# Patient Record
Sex: Female | Born: 1975 | Race: Black or African American | Hispanic: No | Marital: Single | Smoking: Former smoker
Health system: Southern US, Community
[De-identification: ages and names within clinical notes are randomized; demographics above are authoritative.]

## PROBLEM LIST (undated history)

## (undated) DIAGNOSIS — M797 Fibromyalgia: Secondary | ICD-10-CM

## (undated) DIAGNOSIS — G4700 Insomnia, unspecified: Secondary | ICD-10-CM

## (undated) DIAGNOSIS — I208 Other forms of angina pectoris: Secondary | ICD-10-CM

## (undated) DIAGNOSIS — I2089 Other forms of angina pectoris: Secondary | ICD-10-CM

## (undated) DIAGNOSIS — F4310 Post-traumatic stress disorder, unspecified: Secondary | ICD-10-CM

## (undated) DIAGNOSIS — M1990 Unspecified osteoarthritis, unspecified site: Secondary | ICD-10-CM

## (undated) DIAGNOSIS — G629 Polyneuropathy, unspecified: Secondary | ICD-10-CM

## (undated) DIAGNOSIS — F419 Anxiety disorder, unspecified: Secondary | ICD-10-CM

## (undated) HISTORY — DX: Polyneuropathy, unspecified: G62.9

## (undated) HISTORY — DX: Fibromyalgia: M79.7

## (undated) HISTORY — DX: Other forms of angina pectoris: I20.89

## (undated) HISTORY — DX: Other forms of angina pectoris: I20.8

## (undated) HISTORY — DX: Unspecified osteoarthritis, unspecified site: M19.90

## (undated) HISTORY — DX: Insomnia, unspecified: G47.00

## (undated) HISTORY — PX: TUBAL LIGATION: SHX77

## (undated) HISTORY — DX: Anxiety disorder, unspecified: F41.9

## (undated) HISTORY — PX: OTHER SURGICAL HISTORY: SHX169

## (undated) HISTORY — DX: Post-traumatic stress disorder, unspecified: F43.10

---

## 1997-12-01 ENCOUNTER — Inpatient Hospital Stay (HOSPITAL_COMMUNITY): Admission: AD | Admit: 1997-12-01 | Discharge: 1997-12-01 | Payer: Self-pay | Admitting: *Deleted

## 1997-12-04 ENCOUNTER — Inpatient Hospital Stay (HOSPITAL_COMMUNITY): Admission: AD | Admit: 1997-12-04 | Discharge: 1997-12-04 | Payer: Self-pay | Admitting: Obstetrics

## 1998-07-25 ENCOUNTER — Inpatient Hospital Stay (HOSPITAL_COMMUNITY): Admission: AD | Admit: 1998-07-25 | Discharge: 1998-07-25 | Payer: Self-pay | Admitting: Obstetrics

## 1998-09-04 HISTORY — PX: CHOLECYSTECTOMY: SHX55

## 1999-02-27 ENCOUNTER — Inpatient Hospital Stay (HOSPITAL_COMMUNITY): Admission: AD | Admit: 1999-02-27 | Discharge: 1999-02-27 | Payer: Self-pay | Admitting: Obstetrics & Gynecology

## 1999-10-26 ENCOUNTER — Inpatient Hospital Stay (HOSPITAL_COMMUNITY): Admission: AD | Admit: 1999-10-26 | Discharge: 1999-10-26 | Payer: Self-pay | Admitting: *Deleted

## 1999-11-03 ENCOUNTER — Inpatient Hospital Stay (HOSPITAL_COMMUNITY): Admission: AD | Admit: 1999-11-03 | Discharge: 1999-11-03 | Payer: Self-pay | Admitting: Obstetrics & Gynecology

## 2000-04-27 ENCOUNTER — Inpatient Hospital Stay (HOSPITAL_COMMUNITY): Admission: AD | Admit: 2000-04-27 | Discharge: 2000-04-27 | Payer: Self-pay | Admitting: Obstetrics & Gynecology

## 2000-05-02 ENCOUNTER — Inpatient Hospital Stay (HOSPITAL_COMMUNITY): Admission: AD | Admit: 2000-05-02 | Discharge: 2000-05-02 | Payer: Self-pay | Admitting: Obstetrics

## 2000-08-12 ENCOUNTER — Inpatient Hospital Stay (HOSPITAL_COMMUNITY): Admission: AD | Admit: 2000-08-12 | Discharge: 2000-08-12 | Payer: Self-pay | Admitting: Obstetrics & Gynecology

## 2000-08-14 ENCOUNTER — Inpatient Hospital Stay (HOSPITAL_COMMUNITY): Admission: AD | Admit: 2000-08-14 | Discharge: 2000-08-14 | Payer: Self-pay | Admitting: *Deleted

## 2000-09-29 ENCOUNTER — Inpatient Hospital Stay (HOSPITAL_COMMUNITY): Admission: AD | Admit: 2000-09-29 | Discharge: 2000-09-29 | Payer: Self-pay | Admitting: *Deleted

## 2001-02-17 ENCOUNTER — Inpatient Hospital Stay (HOSPITAL_COMMUNITY): Admission: AD | Admit: 2001-02-17 | Discharge: 2001-02-17 | Payer: Self-pay | Admitting: Obstetrics

## 2001-03-09 ENCOUNTER — Encounter: Payer: Self-pay | Admitting: *Deleted

## 2001-03-09 ENCOUNTER — Inpatient Hospital Stay (HOSPITAL_COMMUNITY): Admission: AD | Admit: 2001-03-09 | Discharge: 2001-03-09 | Payer: Self-pay | Admitting: *Deleted

## 2001-04-13 ENCOUNTER — Inpatient Hospital Stay (HOSPITAL_COMMUNITY): Admission: AD | Admit: 2001-04-13 | Discharge: 2001-04-13 | Payer: Self-pay | Admitting: *Deleted

## 2001-05-07 ENCOUNTER — Other Ambulatory Visit: Admission: RE | Admit: 2001-05-07 | Discharge: 2001-05-07 | Payer: Self-pay | Admitting: Obstetrics and Gynecology

## 2001-06-11 ENCOUNTER — Inpatient Hospital Stay (HOSPITAL_COMMUNITY): Admission: AD | Admit: 2001-06-11 | Discharge: 2001-06-11 | Payer: Self-pay | Admitting: *Deleted

## 2001-08-19 ENCOUNTER — Inpatient Hospital Stay (HOSPITAL_COMMUNITY): Admission: AD | Admit: 2001-08-19 | Discharge: 2001-08-19 | Payer: Self-pay | Admitting: Obstetrics and Gynecology

## 2001-09-02 ENCOUNTER — Inpatient Hospital Stay (HOSPITAL_COMMUNITY): Admission: AD | Admit: 2001-09-02 | Discharge: 2001-09-02 | Payer: Self-pay | Admitting: Obstetrics and Gynecology

## 2001-09-19 ENCOUNTER — Inpatient Hospital Stay (HOSPITAL_COMMUNITY): Admission: AD | Admit: 2001-09-19 | Discharge: 2001-09-19 | Payer: Self-pay | Admitting: Obstetrics and Gynecology

## 2001-09-21 ENCOUNTER — Observation Stay (HOSPITAL_COMMUNITY): Admission: AD | Admit: 2001-09-21 | Discharge: 2001-09-22 | Payer: Self-pay

## 2001-09-21 ENCOUNTER — Encounter: Payer: Self-pay | Admitting: Obstetrics and Gynecology

## 2001-10-12 ENCOUNTER — Inpatient Hospital Stay (HOSPITAL_COMMUNITY): Admission: AD | Admit: 2001-10-12 | Discharge: 2001-10-12 | Payer: Self-pay | Admitting: Obstetrics and Gynecology

## 2001-10-17 ENCOUNTER — Inpatient Hospital Stay (HOSPITAL_COMMUNITY): Admission: AD | Admit: 2001-10-17 | Discharge: 2001-10-18 | Payer: Self-pay | Admitting: Otolaryngology

## 2001-11-19 ENCOUNTER — Other Ambulatory Visit: Admission: RE | Admit: 2001-11-19 | Discharge: 2001-11-19 | Payer: Self-pay | Admitting: Obstetrics and Gynecology

## 2002-02-02 ENCOUNTER — Emergency Department (HOSPITAL_COMMUNITY): Admission: EM | Admit: 2002-02-02 | Discharge: 2002-02-03 | Payer: Self-pay | Admitting: *Deleted

## 2002-02-03 ENCOUNTER — Encounter: Payer: Self-pay | Admitting: *Deleted

## 2002-03-21 ENCOUNTER — Encounter: Payer: Self-pay | Admitting: Obstetrics

## 2002-03-21 ENCOUNTER — Inpatient Hospital Stay (HOSPITAL_COMMUNITY): Admission: AD | Admit: 2002-03-21 | Discharge: 2002-03-21 | Payer: Self-pay | Admitting: Obstetrics

## 2002-03-30 ENCOUNTER — Inpatient Hospital Stay (HOSPITAL_COMMUNITY): Admission: AD | Admit: 2002-03-30 | Discharge: 2002-03-30 | Payer: Self-pay | Admitting: Obstetrics

## 2002-03-30 ENCOUNTER — Encounter: Payer: Self-pay | Admitting: Obstetrics

## 2002-04-11 ENCOUNTER — Ambulatory Visit (HOSPITAL_COMMUNITY): Admission: RE | Admit: 2002-04-11 | Discharge: 2002-04-11 | Payer: Self-pay | Admitting: Obstetrics

## 2002-04-11 ENCOUNTER — Encounter: Payer: Self-pay | Admitting: Obstetrics

## 2002-04-29 ENCOUNTER — Ambulatory Visit (HOSPITAL_COMMUNITY): Admission: AD | Admit: 2002-04-29 | Discharge: 2002-04-29 | Payer: Self-pay | Admitting: Obstetrics

## 2002-04-29 ENCOUNTER — Encounter (INDEPENDENT_AMBULATORY_CARE_PROVIDER_SITE_OTHER): Payer: Self-pay | Admitting: Specialist

## 2002-07-11 ENCOUNTER — Inpatient Hospital Stay (HOSPITAL_COMMUNITY): Admission: AD | Admit: 2002-07-11 | Discharge: 2002-07-11 | Payer: Self-pay | Admitting: Obstetrics

## 2002-07-21 ENCOUNTER — Inpatient Hospital Stay (HOSPITAL_COMMUNITY): Admission: AD | Admit: 2002-07-21 | Discharge: 2002-07-21 | Payer: Self-pay | Admitting: Obstetrics

## 2002-09-20 ENCOUNTER — Inpatient Hospital Stay (HOSPITAL_COMMUNITY): Admission: AD | Admit: 2002-09-20 | Discharge: 2002-09-22 | Payer: Self-pay | Admitting: *Deleted

## 2002-09-20 ENCOUNTER — Encounter: Payer: Self-pay | Admitting: Obstetrics

## 2002-12-07 ENCOUNTER — Inpatient Hospital Stay (HOSPITAL_COMMUNITY): Admission: AD | Admit: 2002-12-07 | Discharge: 2002-12-07 | Payer: Self-pay | Admitting: Obstetrics

## 2002-12-19 ENCOUNTER — Inpatient Hospital Stay (HOSPITAL_COMMUNITY): Admission: AD | Admit: 2002-12-19 | Discharge: 2002-12-19 | Payer: Self-pay | Admitting: Obstetrics

## 2003-01-29 ENCOUNTER — Inpatient Hospital Stay (HOSPITAL_COMMUNITY): Admission: AD | Admit: 2003-01-29 | Discharge: 2003-02-01 | Payer: Self-pay | Admitting: Obstetrics

## 2003-02-23 ENCOUNTER — Inpatient Hospital Stay (HOSPITAL_COMMUNITY): Admission: AD | Admit: 2003-02-23 | Discharge: 2003-02-23 | Payer: Self-pay | Admitting: Obstetrics

## 2003-03-07 ENCOUNTER — Inpatient Hospital Stay (HOSPITAL_COMMUNITY): Admission: AD | Admit: 2003-03-07 | Discharge: 2003-03-07 | Payer: Self-pay | Admitting: Obstetrics

## 2003-03-17 ENCOUNTER — Inpatient Hospital Stay (HOSPITAL_COMMUNITY): Admission: AD | Admit: 2003-03-17 | Discharge: 2003-03-19 | Payer: Self-pay | Admitting: Obstetrics

## 2003-03-23 ENCOUNTER — Inpatient Hospital Stay (HOSPITAL_COMMUNITY): Admission: AD | Admit: 2003-03-23 | Discharge: 2003-03-23 | Payer: Self-pay | Admitting: Obstetrics

## 2003-05-21 ENCOUNTER — Encounter (HOSPITAL_BASED_OUTPATIENT_CLINIC_OR_DEPARTMENT_OTHER): Payer: Self-pay | Admitting: General Surgery

## 2003-05-22 ENCOUNTER — Encounter (HOSPITAL_BASED_OUTPATIENT_CLINIC_OR_DEPARTMENT_OTHER): Payer: Self-pay | Admitting: General Surgery

## 2003-05-22 ENCOUNTER — Ambulatory Visit (HOSPITAL_COMMUNITY): Admission: RE | Admit: 2003-05-22 | Discharge: 2003-05-23 | Payer: Self-pay | Admitting: General Surgery

## 2003-05-22 ENCOUNTER — Encounter (INDEPENDENT_AMBULATORY_CARE_PROVIDER_SITE_OTHER): Payer: Self-pay | Admitting: Specialist

## 2004-01-04 ENCOUNTER — Emergency Department (HOSPITAL_COMMUNITY): Admission: EM | Admit: 2004-01-04 | Discharge: 2004-01-04 | Payer: Self-pay | Admitting: Emergency Medicine

## 2004-03-22 ENCOUNTER — Ambulatory Visit (HOSPITAL_COMMUNITY): Admission: AD | Admit: 2004-03-22 | Discharge: 2004-03-22 | Payer: Self-pay | Admitting: *Deleted

## 2004-03-22 ENCOUNTER — Encounter (INDEPENDENT_AMBULATORY_CARE_PROVIDER_SITE_OTHER): Payer: Self-pay | Admitting: Specialist

## 2004-05-02 ENCOUNTER — Emergency Department (HOSPITAL_COMMUNITY): Admission: EM | Admit: 2004-05-02 | Discharge: 2004-05-03 | Payer: Self-pay | Admitting: Emergency Medicine

## 2004-12-06 ENCOUNTER — Inpatient Hospital Stay (HOSPITAL_COMMUNITY): Admission: AD | Admit: 2004-12-06 | Discharge: 2004-12-06 | Payer: Self-pay | Admitting: Obstetrics

## 2004-12-31 ENCOUNTER — Inpatient Hospital Stay (HOSPITAL_COMMUNITY): Admission: AD | Admit: 2004-12-31 | Discharge: 2004-12-31 | Payer: Self-pay | Admitting: Obstetrics

## 2005-02-17 ENCOUNTER — Inpatient Hospital Stay (HOSPITAL_COMMUNITY): Admission: AD | Admit: 2005-02-17 | Discharge: 2005-02-17 | Payer: Self-pay | Admitting: Obstetrics

## 2005-03-19 ENCOUNTER — Inpatient Hospital Stay (HOSPITAL_COMMUNITY): Admission: AD | Admit: 2005-03-19 | Discharge: 2005-03-19 | Payer: Self-pay | Admitting: Obstetrics

## 2005-05-08 ENCOUNTER — Inpatient Hospital Stay (HOSPITAL_COMMUNITY): Admission: AD | Admit: 2005-05-08 | Discharge: 2005-05-08 | Payer: Self-pay | Admitting: Obstetrics

## 2005-06-03 ENCOUNTER — Inpatient Hospital Stay (HOSPITAL_COMMUNITY): Admission: AD | Admit: 2005-06-03 | Discharge: 2005-06-03 | Payer: Self-pay | Admitting: Obstetrics

## 2005-06-17 ENCOUNTER — Inpatient Hospital Stay (HOSPITAL_COMMUNITY): Admission: AD | Admit: 2005-06-17 | Discharge: 2005-06-17 | Payer: Self-pay | Admitting: Obstetrics

## 2005-07-07 ENCOUNTER — Encounter (INDEPENDENT_AMBULATORY_CARE_PROVIDER_SITE_OTHER): Payer: Self-pay | Admitting: Specialist

## 2005-07-07 ENCOUNTER — Inpatient Hospital Stay (HOSPITAL_COMMUNITY): Admission: AD | Admit: 2005-07-07 | Discharge: 2005-07-10 | Payer: Self-pay | Admitting: Obstetrics

## 2006-07-17 ENCOUNTER — Inpatient Hospital Stay (HOSPITAL_COMMUNITY): Admission: AD | Admit: 2006-07-17 | Discharge: 2006-07-18 | Payer: Self-pay | Admitting: Obstetrics

## 2007-01-29 ENCOUNTER — Ambulatory Visit: Payer: Self-pay | Admitting: Gastroenterology

## 2007-01-29 LAB — CONVERTED CEMR LAB
AST: 17 units/L (ref 0–37)
Albumin: 3.9 g/dL (ref 3.5–5.2)
BUN: 13 mg/dL (ref 6–23)
Basophils Relative: 0.7 % (ref 0.0–1.0)
Bilirubin, Direct: 0.1 mg/dL (ref 0.0–0.3)
MCHC: 35.2 g/dL (ref 30.0–36.0)
MCV: 88.1 fL (ref 78.0–100.0)
Neutro Abs: 3.8 10*3/uL (ref 1.4–7.7)
Neutrophils Relative %: 55 % (ref 43.0–77.0)
Platelets: 290 10*3/uL (ref 150–400)
RBC: 4.36 M/uL (ref 3.87–5.11)
RDW: 11.8 % (ref 11.5–14.6)
Sodium: 140 meq/L (ref 135–145)
Total Bilirubin: 0.6 mg/dL (ref 0.3–1.2)
WBC: 6.9 10*3/uL (ref 4.5–10.5)
hCG, Beta Chain, Quant, S: <0.5 milliintl units/mL

## 2007-01-31 ENCOUNTER — Encounter: Payer: Self-pay | Admitting: Gastroenterology

## 2007-11-30 ENCOUNTER — Inpatient Hospital Stay (HOSPITAL_COMMUNITY): Admission: AD | Admit: 2007-11-30 | Discharge: 2007-11-30 | Payer: Self-pay | Admitting: Obstetrics

## 2008-01-02 ENCOUNTER — Inpatient Hospital Stay (HOSPITAL_COMMUNITY): Admission: AD | Admit: 2008-01-02 | Discharge: 2008-01-02 | Payer: Self-pay | Admitting: Obstetrics

## 2008-03-02 ENCOUNTER — Emergency Department (HOSPITAL_COMMUNITY): Admission: EM | Admit: 2008-03-02 | Discharge: 2008-03-02 | Payer: Self-pay | Admitting: Emergency Medicine

## 2008-11-22 ENCOUNTER — Emergency Department (HOSPITAL_COMMUNITY): Admission: EM | Admit: 2008-11-22 | Discharge: 2008-11-22 | Payer: Self-pay | Admitting: Emergency Medicine

## 2009-02-14 ENCOUNTER — Emergency Department (HOSPITAL_COMMUNITY): Admission: EM | Admit: 2009-02-14 | Discharge: 2009-02-14 | Payer: Self-pay | Admitting: Emergency Medicine

## 2009-04-21 ENCOUNTER — Emergency Department (HOSPITAL_COMMUNITY): Admission: EM | Admit: 2009-04-21 | Discharge: 2009-04-21 | Payer: Self-pay | Admitting: Emergency Medicine

## 2010-06-03 ENCOUNTER — Ambulatory Visit: Payer: Self-pay | Admitting: Nurse Practitioner

## 2010-06-03 ENCOUNTER — Inpatient Hospital Stay (HOSPITAL_COMMUNITY): Admission: AD | Admit: 2010-06-03 | Discharge: 2010-06-04 | Payer: Self-pay | Admitting: Obstetrics and Gynecology

## 2010-11-16 LAB — GC/CHLAMYDIA PROBE AMP, GENITAL: Chlamydia, DNA Probe: NEGATIVE

## 2010-11-16 LAB — URINALYSIS, ROUTINE W REFLEX MICROSCOPIC
Ketones, ur: NEGATIVE mg/dL
Specific Gravity, Urine: 1.03 (ref 1.005–1.030)
Urobilinogen, UA: 0.2 mg/dL (ref 0.0–1.0)
pH: 6 (ref 5.0–8.0)

## 2010-11-16 LAB — WET PREP, GENITAL

## 2010-12-12 LAB — RAPID STREP SCREEN (MED CTR MEBANE ONLY): Streptococcus, Group A Screen (Direct): NEGATIVE

## 2011-01-02 ENCOUNTER — Emergency Department (HOSPITAL_COMMUNITY)
Admission: EM | Admit: 2011-01-02 | Discharge: 2011-01-02 | Disposition: A | Discharge status: Home / Self Care | Payer: Medicaid Other | Attending: Emergency Medicine | Admitting: Emergency Medicine

## 2011-01-02 DIAGNOSIS — R109 Unspecified abdominal pain: Secondary | ICD-10-CM | POA: Insufficient documentation

## 2011-01-02 DIAGNOSIS — M549 Dorsalgia, unspecified: Secondary | ICD-10-CM | POA: Insufficient documentation

## 2011-01-02 LAB — URINALYSIS, ROUTINE W REFLEX MICROSCOPIC
Bilirubin Urine: NEGATIVE
Glucose, UA: NEGATIVE mg/dL
Hgb urine dipstick: NEGATIVE
Ketones, ur: NEGATIVE mg/dL
Specific Gravity, Urine: 1.02 (ref 1.005–1.030)
Urobilinogen, UA: 1 mg/dL (ref 0.0–1.0)
pH: 7.5 (ref 5.0–8.0)

## 2011-01-17 NOTE — Assessment & Plan Note (Signed)
[facility]                         GASTROENTEROLOGY OFFICE NOTE   NYLANI, MICHETTI                        MRN:          [id number]  DATE:01/29/2007                            DOB:          [birth year]    REFERRING PHYSICIAN:  Kathreen Cosier, M.D.   NEW GASTROINTESTINAL CONSULTATION:   REASON FOR REFERRAL:  Dr. Gaynell Face asked me to evaluate Haley Reyes in  consultation regarding upper and lower GI symptoms.   HISTORY OF PRESENT ILLNESS:  Haley Reyes is a very pleasant 35 year old  woman who has a constellation of upper and lower GI symptoms.  First,  she has had loose stools up to 2-3 times a day, nonbloody, ever since  her gallbladder was removed in approximately 2003.  She does not  generally have nocturnal symptoms.  Second, she has mild vomiting which  seems to be worse in the past 5-6 months; she says she will vomit once  to twice a day.  She has mild nausea recently.  She has fairly constant  gas and flatulence.  She says that she has definitely noticed that milk  tends to make matters worse, but she continues to eat other diary  products such as cheese and ice cream.  She has very are pyrosis.   REVIEW OF SYSTEMS:  Notable for a 5- to 10-pound weight gain in the past  year.  The rest of her review of systems is essentially normal and is  available on our nursing intake sheet.   PAST MEDICAL HISTORY:  1. Tubal ligation in 2006.  2. Gallbladder surgery, 2003.  3. Chronic headaches.  4. Depression.  5. Panic disorder.  6. Cervical surgery (LEEP?) in 2003 for moderate dysplasia.   CURRENT MEDICINES:  Claritin.   ALLERGIES:  No known drug allergies.   SOCIAL HISTORY:  Single with 4 children.  Lives by herself.  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  Smokes a  pack of cigarettes per day.  Drinks 2-3 caffeinated beverages a day,  approximately 1 beer a night.   FAMILY HISTORY:  Diabetes.  No colon  cancer or colon polyps in family.   PHYSICAL EXAMINATION:  Five feet 2 inches, 132 pounds.  Blood pressure  102/64, pulse 84.  CONSTITUTIONAL:  Generally well-appearing.  NEUROLOGIC:  Alert and oriented x3.  EYES:  Extraocular movements intact.  MOUTH:  Oropharynx moist, no lesions.  NECK:  Supple with no lymphadenopathy.  CARDIOVASCULAR:  Regular rate and rhythm.  ABDOMEN:  Soft, nontender and non-distended.  Normal bowel sounds.  EXTREMITIES:  No lower extremity edema.  SKIN:  No rash or lesions on visible extremities.   ASSESSMENT AND PLAN:  [age]-year-old woman with upper and lower  gastrointestinal symptoms.   First, it is not uncommon for people to have loose stools following  cholecystectomy; this is often bile-salt-related.  She has definitely  noticed that she is intolerant to milk, but continues to eat other dairy  products and so I am suspicious she may have a touch of lactose  intolerance.  She drinks at least 2-3 caffeinated  beverages a day as  well as beer a day and possibly these are causing some of her lower  gastrointestinal symptoms.  I recommended that she first stop dairy  completely for 1 week and if she is not better, then I recommended she  stop caffeinated beverages completely for 1 week.  If she notices no  difference with her symptoms at that point, she will begin taking  cholestyramine once to twice a day to see if we can regulate her bowels  that way.  She will get a set of laboratory tests today including a  complete metabolic panel, thyroid testing, a CBC and stool studies.  I  am also going to get a pregnancy test on her.  She did have her tubes  tied, but she says her lower abdomen is increasing in size and with  these symptoms, it makes one wonder.  She will return to see me in 4  weeks and sooner if needed.     Haley Fee, MD  Electronically Signed    DPJ/MedQ  DD: 01/29/2007  DT: 01/29/2007  Job #: [id number]   cc:   Kathreen Cosier, M.D.

## 2011-01-20 NOTE — Discharge Summary (Signed)
   NAME:  Haley Reyes, Haley Reyes                           ACCOUNT NO.:  [id number]   MEDICAL RECORD NO.:  [id number]                   PATIENT TYPE:  INP   LOCATION:  9303                                 FACILITY:  [facility]   PHYSICIAN:  Kathreen Cosier, M.D.           DATE OF BIRTH:  [birth year]   DATE OF ADMISSION:  09/20/2002  DATE OF DISCHARGE:  09/22/2002                                 DISCHARGE SUMMARY   HISTORY OF PRESENT ILLNESS:  The patient is a 35 year old gravida 4, para 2-  0-1-2 with an EDC of 03/08/2003 by ultrasound who is 13 weeks and 4 days  pregnancy.  She was admitted because of hyperemesis.   LABORATORY DATA:  On admission, white count 12.7, hemoglobin 11.5, platelets  343.  Sodium 136, potassium 3.5, chloride 101, CO2 23, glucose 83.  Urine  was negative for leukocyte esterase and nitrites.  Ketones were greater than  80.   HOSPITAL COURSE:  The patient was admitted for IV hydration.  She was  started on Reglan 10 mg p.o. t.i.d.  From the time of admission, the patient  had one episode of emesis on 09/21/2002 and none on 09/22/2002.  She was tried  on a regular diet on 09/22/2002 and did well.  The patient desired to go home  on 09/23/2002.   DISCHARGE MEDICATIONS:  Reglan 10 mg p.o. t.i.d.   DISCHARGE DIAGNOSIS:  Status post hyperemesis at 13+ weeks.                                                Kathreen Cosier, M.D.    BAM/MEDQ  D:  10/27/2002  T:  10/27/2002  Job:  [id number]

## 2011-01-20 NOTE — Discharge Summary (Signed)
   NAME:  Haley Reyes, Haley Reyes                           ACCOUNT NO.:  [id number]   MEDICAL RECORD NO.:  [id number]                   PATIENT TYPE:  INP   LOCATION:  9153                                 [facility]ACILITY[facility]   PHYSICIAN:  Kathreen Cosier, M.D.           DATE OF BIRTH:  1975/11/26   DATE OF ADMISSION:  01/29/2003  DATE OF DISCHARGE:  02/01/2003                                 DISCHARGE SUMMARY   DISCHARGE SUMMARY:  The patient is a 35 year old gravida 5, para 2-0-2-2,  [facility] March 22, 2003.  Her pregnancy was complicated by premature contractions  and she had been on terbutaline at home and she refused to take that because  she states, it did not work.  So, she was brought in and started on mag  sulfate.  By Jan 30, 2003 she was having less than four contractions per  hour on 2 grams of mag.  She was started on Motrin 800 every eight hours.  Her cervix was closed, vertex, -3.  She started to feel better and on May 29  the mag sulfate was discontinued.  She was started on Procardia 10 mg p.o.  every six hours.  The patient did well and was discharged home on Feb 01, 2003 on bedrest and on Procardia 10 mg p.o. every six hours.  Instructions  to keep herself well hydrated and on bedrest as much as possible.  To see me  in two weeks.   DISCHARGE DIAGNOSIS:  1. Premature contractions.  2. Intrauterine pregnancy.                                               Kathreen Cosier, M.D.    BAM/MEDQ  D:  02/26/2003  T:  02/28/2003  Job:  [id number]

## 2011-01-20 NOTE — Op Note (Signed)
NAME:  Haley Reyes, Haley Reyes                           ACCOUNT NO.:  [id number]   MEDICAL RECORD NO.:  [id number]                   PATIENT TYPE:  OIB   LOCATION:  5710                                 FACILITY:  [facility]   PHYSICIAN:  Leonie Man, M.D.                DATE OF BIRTH:  01-Sep-1976   DATE OF PROCEDURE:  05/22/2003  DATE OF DISCHARGE:                                 OPERATIVE REPORT   PREOPERATIVE DIAGNOSES:  1. Chronic calculous cholecystitis.  2. Probable choledocholithiasis.   POSTOPERATIVE DIAGNOSIS:  Chronic calculous cholecystitis.   PROCEDURE:  Laparoscopic cholecystectomy with intraoperative cholangiogram.   SURGEON:  Leonie Man, M.D.   ASSISTANT:  Joanne Gavel, M.D.   ANESTHESIA:  General.   NOTE:  This patient is a 35 year old recently postpartum female presenting  with epigastric and right-sided abdominal pain associated with nausea and  vomiting.  She has had a transient episode of hyperamylasemia with elevation  of her liver function studies, which have promptly returned to normal.  She  comes to the operating room now after the risks and potential benefits have  been fully discussed, all questions answered, and consent obtained.   DESCRIPTION OF PROCEDURE:  Following the induction of satisfactory general  anesthesia with the patient positioned supinely, the abdomen was routinely  prepped and draped to be included in the sterile operative field.  Open  laparoscopy is created through a supraumbilical incision with the insertion  of a Hasson cannula and insufflation of the peritoneal cavity to 14 mmHg  pressure using carbon dioxide.  The camera is inserted and a visual  exploration of the abdomen carried out.  The liver edge is sharp, the liver  surface is smooth.  The gallbladder did not appear to be chronically scarred  but did have some scarring down around the area of the ampulla.  The  anterior gastric wall and duodenal sweep appeared to be normal, and  none of  the large or small intestine viewed appeared to be abnormal.  Pelvic organs  were not visualized.   Under direct vision epigastric and lateral ports were placed, the  gallbladder is grasped and retracted cephalad, and dissection carried down  to the region of the ampulla with isolation of the cystic artery and cystic  duct.  The cystic artery was doubly clipped and transected.  The cystic duct  was clipped proximally and opened.  A cholangiocatheter was then placed  inside the cystic duct and one-half strength Hypaque was injected into the  extrahepatic biliary system with the resulting cholangiogram showing free  flow of contrast into the duodenum with no filling defects, normal-caliber  biliary ducts.  The cholangiocatheter was then removed and the cystic duct  doubly clipped and transected.  The gallbladder was dissected free from the  liver bed using electrocautery.  At the end of the dissection all areas of  dissection were checked for hemostasis  and additional bleeding points  treated with electrocautery.  The right upper quadrant was thoroughly  irrigated with normal saline and sucked dry.  The gallbladder was retrieved  through the umbilical port without difficulty.  The sponge, instrument, and  sharp counts were verified.  The pneumoperitoneum was allowed to deflate and  the wounds closed in layers as follows:  The umbilical wound in two  layers with 0 Vicryl and 4-0 Monocryl, epigastric and lateral flank wounds  closed with 4-0 Monocryl sutures.  All wounds reinforced with Steri-Strips  and sterile dressings applied.  Anesthetic reversed.  The patient removed  from the operating room to the recovery room in stable condition.  She  tolerated the procedure well.                                                Leonie Man, M.D.    PB/MEDQ  D:  05/22/2003  T:  05/23/2003  Job:  [id number]

## 2011-01-20 NOTE — Op Note (Signed)
NAMEPIPPA, HANIF NO.:  [id number]   MEDICAL RECORD NO.:  [id number]          PATIENT TYPE:  INP   LOCATION:  9106                          FACILITY:  [facility]   PHYSICIAN:  Kathreen Cosier, M.D.DATE OF BIRTH:  1976-02-15   DATE OF PROCEDURE:  07/08/2005  DATE OF DISCHARGE:                                 OPERATIVE REPORT   PREOPERATIVE DIAGNOSES:  Multiparity.   PROCEDURE:  Postpartum tubal ligation.   DESCRIPTION OF PROCEDURE:  Under general anesthesia, the patient in the  supine position, abdomen prepped and draped, bladder emptied straight  catheter.  Midline subumbilical incision 1 inch long was made and carried  down to the fascia.  The fascia cleaned, grasped with 2 Kochers, and then  the fascia and the peritoneum opened with Mayo scissors.  Left tube grasped  in mid portion with a Babcock clamp, tube traced to the fimbria, 0 plain  suture placed in the mesosalpinx below the portion of tube within the clamp.  This was tied, and approximately 1 inch of tube was transected.  Hemostasis  was satisfactory.  Procedure done in a similar fashion on the other side.  Abdomen closed in layers, peritoneum and fascia closed with continuous  suture of 0 Dexon, the skin closed with subcutaneous stitch of 4-0 Monocryl.  Blood loss minimal.           ______________________________  Kathreen Cosier, M.D.     BAM/MEDQ  D:  07/08/2005  T:  07/08/2005  Job:  [id number]

## 2011-01-20 NOTE — Op Note (Signed)
NAME:  Haley Reyes, Haley Reyes                           ACCOUNT NO.:  [id number]   MEDICAL RECORD NO.:  [id number]                   PATIENT TYPE:  AMB   LOCATION:  [facility]                                 [facility]ACILITY:  [facility]   PHYSICIAN:  Sherry A. Rosalio Macadamia, M.D.           DATE OF BIRTH:  01-06-1976   DATE OF PROCEDURE:  04/29/2002  DATE OF DISCHARGE:                                 OPERATIVE REPORT   PREOPERATIVE DIAGNOSES:  1. Intrauterine pregnancy.  2. Incomplete abortion.   POSTOPERATIVE DIAGNOSES:  1. Intrauterine pregnancy.  2. Incomplete abortion.   PROCEDURE:  Dilatation and evacuation.   SURGEON:  Sherry A. Rosalio Macadamia, M.D.   ANESTHESIA:  MAC.   INDICATIONS:  This is a 34 year old G58, P1-1-1-2 female whose last menstrual  period was approximately February 03, 2002.  The patient was seen in the  emergency room on March 21, 2002, because of left sided abdominal pain.  Ultrasound was performed revealing a left ovarian cystic structure  consistent with corpus luteum cyst or ectopic pregnancy.  No intrauterine  pregnancy could be seen on ultrasound.  The patient was treated with  methotrexate, however, it is understood that she did not return for followup  quantitative hCG results.  Initial hCG was approximately 1200.  Followup  ultrasound on March 30, 2002, revealed an intrauterine sac at approximately 5-  1/7 weeks.  Repeat ultrasound on April 11, 2002, revealed an intrauterine  pregnancy at 7-2/7 weeks with cardiac activity present.  The sac, however,  was not felt to be normal.  The patient noted onset of bleeding and cramping  one day prior to admission with increased bleeding and cramping on the day  of admission.  Pelvic examination revealed the cervix to be approximately 1  cm dilated with tissue starting to protrude through the os.  Because of  this, the diagnosis of incomplete abortion was made.  The patient was  brought to the operating room for D&E.   FINDINGS:  Eight week size  anteflexed uterus.  No adnexal mass.  Amniotic  fluid ruptured just prior to D&E.  The cervix was dilated 1 cm with tissue  in os.   DESCRIPTION OF PROCEDURE:  The patient was brought into the operating room  and given adequate IV sedation.  She was placed in the dorsal lithotomy  position.  The perineum was washed with Hibiclens.  The patient was draped  in the sterile fashion.  Speculum was placed within the vagina.  The vagina  was washed with Hibiclens.  A paracervical block was administered with 1%  Nesacaine.  The anterior lip of the cervix was grasped with a single-tooth  tenaculum.  The cervix was not needed to be sounded or dilated.  An 8 mm  curved curet was introduced into the endometrial cavity.  Amniotic fluid had  already ruptured.  Suction was applied.  Tissue was obtained.  Uterine walls  were  easily palpated with the curet.  Suction was continued until no further  tissue was present and no significant bleeding was present.  The tenaculum  was removed from the cervix.  A small amount of bleeding was  present from the tenaculum site.  This was stopped with pressure.  All  instruments were removed from the vagina.  The patient was taken out of the  dorsal lithotomy position.  She was awakened.  She was removed from the  operating table to a stretcher in stable condition.  Complications were  none.  Estimated blood loss 10 cc.                                               Sherry A. Rosalio Macadamia, M.D.    SAD/MEDQ  D:  04/29/2002  T:  04/30/2002  Job:  [id number]

## 2011-01-20 NOTE — Op Note (Signed)
NAME:  CURTINA, GRILLS                           ACCOUNT NO.:  [id number]   MEDICAL RECORD NO.:  [id number]                   PATIENT TYPE:  AMB   LOCATION:  [facility]                                 [facility]ACILITY[facility]   PHYSICIAN:  Conni Elliot, M.D.             DATE OF BIRTH:  [birth year]   DATE OF PROCEDURE:  03/22/2004  DATE OF DISCHARGE:                                 OPERATIVE REPORT   PREOPERATIVE DIAGNOSES:  Retained products of conception following  therapeutic abortion.   POSTOPERATIVE DIAGNOSES:  Retained products of conception following  therapeutic abortion.   OPERATION:  Dilatation evacuation.   SURGEON:  Conni Elliot, M.D.   ANESTHESIA:  MAC.   FINDINGS:  The uterus is approximately 9-10 week size, mid plane to  anterior. The os was open and with minimal dilatation accepted up to a 31  dilator.   DESCRIPTION OF PROCEDURE:  After placing the patient in the dorsal lithotomy  position, the patient was placed under MAC anesthesia. The perineum and  vagina was prepped with __________ solution.  With a speculum placed in the  posterior vagina, the anterior cervix was grasped with a tenaculum. A  paracervical block was placed. The uterus was slowly dilated although it was  of minimal dilatation force and was taken up to a #31 dilator and then the  #10 suction curettage was utilized to evacuate the uterus and a moderate  amount of material was obtained.  __________ was identified by sharp  curettage. Estimated blood loss was less than 100 mL.                                               Conni Elliot, M.D.    ASG/MEDQ  D:  03/22/2004  T:  03/23/2004  Job:  [id number]

## 2011-05-29 LAB — HCG, SERUM, QUALITATIVE: Preg, Serum: NEGATIVE

## 2011-05-29 LAB — WET PREP, GENITAL
Clue Cells Wet Prep HPF POC: NONE SEEN
Yeast Wet Prep HPF POC: NONE SEEN

## 2011-05-29 LAB — URINALYSIS, ROUTINE W REFLEX MICROSCOPIC
Bilirubin Urine: NEGATIVE
Ketones, ur: NEGATIVE
Nitrite: NEGATIVE
Protein, ur: NEGATIVE
Urobilinogen, UA: 0.2

## 2011-05-29 LAB — CBC
HCT: 38
Hemoglobin: 13.1
RBC: 4.23
WBC: 10.8 — ABNORMAL HIGH

## 2011-05-29 LAB — DIFFERENTIAL
Basophils Absolute: 0.1
Basophils Relative: 1
Eosinophils Relative: 2
Monocytes Absolute: 0.8

## 2011-05-30 LAB — POCT PREGNANCY, URINE: Operator id: 251141

## 2011-05-30 LAB — URINALYSIS, ROUTINE W REFLEX MICROSCOPIC
Bilirubin Urine: NEGATIVE
Hgb urine dipstick: NEGATIVE
Ketones, ur: NEGATIVE
Protein, ur: NEGATIVE
Urobilinogen, UA: 0.2

## 2011-05-30 LAB — WET PREP, GENITAL
Clue Cells Wet Prep HPF POC: NONE SEEN
Trich, Wet Prep: NONE SEEN
Yeast Wet Prep HPF POC: NONE SEEN

## 2011-06-01 LAB — RAPID STREP SCREEN (MED CTR MEBANE ONLY): Streptococcus, Group A Screen (Direct): POSITIVE — AB

## 2012-02-23 ENCOUNTER — Encounter: Payer: Self-pay | Admitting: Pulmonary Disease

## 2012-02-26 ENCOUNTER — Institutional Professional Consult (permissible substitution): Payer: Medicaid Other | Admitting: Pulmonary Disease

## 2012-03-06 ENCOUNTER — Encounter: Payer: Self-pay | Admitting: Pulmonary Disease

## 2012-04-09 ENCOUNTER — Institutional Professional Consult (permissible substitution): Payer: Medicaid Other | Admitting: Pulmonary Disease

## 2012-05-08 ENCOUNTER — Institutional Professional Consult (permissible substitution): Payer: Medicaid Other | Admitting: Pulmonary Disease

## 2013-01-06 ENCOUNTER — Encounter: Payer: Self-pay | Admitting: Pulmonary Disease

## 2013-01-06 ENCOUNTER — Ambulatory Visit (INDEPENDENT_AMBULATORY_CARE_PROVIDER_SITE_OTHER): Payer: Medicaid Other | Admitting: Pulmonary Disease

## 2013-01-06 ENCOUNTER — Telehealth: Payer: Self-pay | Admitting: Pulmonary Disease

## 2013-01-06 VITALS — BP 110/70 | HR 77 | Temp 98.2°F | Ht 62.0 in | Wt 136.8 lb

## 2013-01-06 DIAGNOSIS — G4700 Insomnia, unspecified: Secondary | ICD-10-CM

## 2013-01-06 DIAGNOSIS — F518 Other sleep disorders not due to a substance or known physiological condition: Secondary | ICD-10-CM

## 2013-01-06 DIAGNOSIS — F5104 Psychophysiologic insomnia: Secondary | ICD-10-CM | POA: Insufficient documentation

## 2013-01-06 DIAGNOSIS — Z72821 Inadequate sleep hygiene: Secondary | ICD-10-CM | POA: Insufficient documentation

## 2013-01-06 NOTE — Telephone Encounter (Signed)
[id number] x1 

## 2013-01-06 NOTE — Assessment & Plan Note (Signed)
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  I have outlined stimulus control therapy for her, reviewed with her the bad habits regarding sleep, and also discussed how to reestablish her normal circadian rhythm.  This is going to be very difficult for her, especially since she has gotten her days and nights mixed up.  I have explained to her that sleeping medications are not the answer for this problem.  If she tries the above methods and continues to have problems, I would strongly suggest referral to behavioral medicine for cognitive behavioral therapy.

## 2013-01-06 NOTE — Telephone Encounter (Signed)
Spoke to pt. States that she has to stand to relieve the pain in her thighs. Her thighs "tingle" most of the day. She wanted KC to be aware of this information. I advised that I would forward this message to him.

## 2013-01-06 NOTE — Progress Notes (Signed)
Subjective:    Patient ID: Haley Reyes, female    DOB: 09/14/1975, 37 y.o.   MRN: [id number]  HPI The patient is a 37 year old female who I've been asked to see for insomnia.  The patient states that she has had sleeping issues for years and years, and this often results in her back going to bed until 2 to 3 AM in the morning.  She primarily has issues with both sleep onset and maintenance, which then results in significant daytime sleepiness.  She is has never had consistent snoring or an abnormal breathing pattern during sleep, but is unsure if she kicks during the night.  She denies any symptoms consistent with restless leg syndrome.  The patient walk and watch television in bed around 2 AM, and also reads in bed at times.  It should be noted that her children often sleep in the bed with her, or on the floor on a mat.  She feels that her mind racing is the issue with sleep onset, and she will typically stay in bed and toss and turn.  She also notes chronic pain from her fibromyalgia.  She often will get up during the night and eat and drink.  She drinks 2 cups of coffee during the am, but no caffeine thereafter.  She notes severe sleepiness during the day, and will nap quite a bit during the afternoon. Her epworth score today is 24.      Sleep Questionnaire What time do you typically go to bed?( Between what hours) 12a-2am 12a-2am at 1133 on 01/06/13 by Nita Sells, CMA How long does it take you to fall asleep? hours---unless pt takes medication to help sleep hours---unless pt takes medication to help sleep at 1133 on 01/06/13 by Nita Sells, CMA How many times during the night do you wake up? 3 3 at 1133 on 01/06/13 by Nita Sells, CMA What time do you get out of bed to start your day? [id number] 5am-6am at 1133 on 01/06/13 by Nita Sells, CMA Do you drive or operate heavy machinery in your occupation? No No at 1133 on 01/06/13 by Nita Sells, CMA How much has your weight changed  (up or down) over the past two years? (In pounds) 20 lb (9.072 kg)20 lb (9.072 kg) +/- pt ranges from 136lb-150lb at 1133 on 01/06/13 by Marjo Bicker Mabe, CMA Have you ever had a sleep study before? No No at 1133 on 01/06/13 by Marjo Bicker Mabe, CMA If yes, location of study? If yes, date of study? Do you currently use CPAP? No No at 1133 on 01/06/13 by Marjo Bicker Mabe, CMA Do you wear oxygen at any time? No No at 1133 on 01/06/13 by Marjo Bicker Mabe, CMA   Review of Systems  Constitutional: Positive for unexpected weight change. Negative for fever.  HENT: Positive for congestion, sneezing, dental problem and sinus pressure. Negative for ear pain, nosebleeds, sore throat, rhinorrhea, trouble swallowing and postnasal drip.   Eyes: Negative for redness and itching.  Respiratory: Positive for cough and shortness of breath. Negative for chest tightness and wheezing.   Cardiovascular: Positive for palpitations and leg swelling ( feet swelling).  Gastrointestinal: Positive for abdominal pain. Negative for nausea and vomiting.       Acid heartburn//indigestion  Genitourinary: Negative for dysuria.  Musculoskeletal: Positive for joint swelling and arthralgias.  Skin: Positive for rash ( itching).  Neurological: Positive for headaches.  Hematological: Does not bruise/bleed easily.  Psychiatric/Behavioral: Positive for  dysphoric mood. The patient is nervous/anxious.        Objective:   Physical Exam Constitutional:  Well developed, no acute distress  HENT:  Nares patent without discharge, enlarged turbinates.  Oropharynx without exudate, palate and uvula are mildly elongated.  Eyes:  Perrla, eomi, no scleral icterus  Neck:  No JVD, no TMG  Cardiovascular:  Normal rate, regular rhythm, no rubs or gallops.  No murmurs        Intact distal pulses  Pulmonary :  Normal breath sounds, no stridor or respiratory distress   No rales, rhonchi, or wheezing  Abdominal:  Soft, nondistended, bowel  sounds present.  No tenderness noted.   Musculoskeletal:  No lower extremity edema noted.  Lymph Nodes:  No cervical lymphadenopathy noted  Skin:  No cyanosis noted  Neurologic:  Alert, appropriate, moves all 4 extremities without obvious deficit.         Assessment & Plan:

## 2013-01-06 NOTE — Assessment & Plan Note (Signed)
The pt has a lot of sleep hygiene issues, and I have reviewed each of these with her.

## 2013-01-06 NOTE — Patient Instructions (Addendum)
Try and go to bed no later than . No reading, tv, or electronic devices in bedroom If you are not able to initiate sleep within , leave the bedroom to watch tv or read until you get sleepy.  Do this as many times as it takes until you finally fall asleep in your bedroom. No napping during the day, or you will never fix your sleep issues. No caffeine after 10am. Would try and limit other people sleeping in your bedroom, since this can disrupt your sleep.  Please call in 3 weeks to give me some feedback with how things are going.

## 2013-06-09 ENCOUNTER — Encounter: Payer: Self-pay | Admitting: Physical Medicine & Rehabilitation

## 2013-06-24 ENCOUNTER — Encounter: Payer: Medicaid Other | Admitting: Physical Medicine & Rehabilitation

## 2013-07-23 ENCOUNTER — Encounter: Payer: Medicaid Other | Attending: Physical Medicine & Rehabilitation | Admitting: Physical Medicine & Rehabilitation

## 2013-07-23 ENCOUNTER — Encounter: Payer: Self-pay | Admitting: Physical Medicine & Rehabilitation

## 2013-07-23 VITALS — BP 96/60 | HR 89 | Resp 14 | Ht 61.0 in | Wt 127.0 lb

## 2013-07-23 DIAGNOSIS — M7580 Other shoulder lesions, unspecified shoulder: Secondary | ICD-10-CM | POA: Insufficient documentation

## 2013-07-23 DIAGNOSIS — M549 Dorsalgia, unspecified: Secondary | ICD-10-CM

## 2013-07-23 DIAGNOSIS — IMO0001 Reserved for inherently not codable concepts without codable children: Secondary | ICD-10-CM

## 2013-07-23 DIAGNOSIS — R5381 Other malaise: Secondary | ICD-10-CM | POA: Insufficient documentation

## 2013-07-23 DIAGNOSIS — M25569 Pain in unspecified knee: Secondary | ICD-10-CM | POA: Insufficient documentation

## 2013-07-23 DIAGNOSIS — M7581 Other shoulder lesions, right shoulder: Secondary | ICD-10-CM

## 2013-07-23 DIAGNOSIS — Z5181 Encounter for therapeutic drug level monitoring: Secondary | ICD-10-CM

## 2013-07-23 DIAGNOSIS — M542 Cervicalgia: Secondary | ICD-10-CM

## 2013-07-23 DIAGNOSIS — M170 Bilateral primary osteoarthritis of knee: Secondary | ICD-10-CM | POA: Insufficient documentation

## 2013-07-23 DIAGNOSIS — M1710 Unilateral primary osteoarthritis, unspecified knee: Secondary | ICD-10-CM

## 2013-07-23 DIAGNOSIS — G4700 Insomnia, unspecified: Secondary | ICD-10-CM | POA: Insufficient documentation

## 2013-07-23 DIAGNOSIS — M19019 Primary osteoarthritis, unspecified shoulder: Secondary | ICD-10-CM | POA: Insufficient documentation

## 2013-07-23 DIAGNOSIS — M67919 Unspecified disorder of synovium and tendon, unspecified shoulder: Secondary | ICD-10-CM

## 2013-07-23 DIAGNOSIS — Z79899 Other long term (current) drug therapy: Secondary | ICD-10-CM

## 2013-07-23 DIAGNOSIS — M797 Fibromyalgia: Secondary | ICD-10-CM | POA: Insufficient documentation

## 2013-07-23 MED ORDER — AMITRIPTYLINE HCL 10 MG PO TABS: 10.0000 mg | ORAL_TABLET | Freq: Every day | ORAL | Status: DC

## 2013-07-23 MED ORDER — GABAPENTIN 100 MG PO CAPS: 100.0000 mg | ORAL_CAPSULE | Freq: Three times a day (TID) | ORAL | Status: DC

## 2013-07-23 MED ORDER — MOBIC 15 MG PO TABS: 15.0000 mg | ORAL_TABLET | Freq: Every day | ORAL | Status: DC

## 2013-07-23 NOTE — Patient Instructions (Signed)
PLEASE OBTAIN THE LABWORK FORM THE LAST 6 MONTHS FROM YOUR FAMILY DOCTOR.   BEGIN THE ELAVIL 10MG  AT NIGHT FOR 3 DAYS, THEN YOU MAY START THE GABAPENTIN AS ORDERED.

## 2013-07-23 NOTE — Progress Notes (Signed)
Subjective:    Patient ID: Haley Reyes, female    DOB: 1976/03/02, 37 y.o.   MRN: [id number]  HPI  This is a 37 yo african [place] female here for an initial evaluation regarding her chronic pain. She was diagnoses with fibromyalgia 4 years ago. (She was diagnosed by a rhematologist in [facility]). She doesn't recall being diagnosed with an inflammatory arthritis.   She complains of increased pain in her hands and her knees---even in her feet in the morning when she awakens. She notes pain in her shoulders as well. She hasn't had any xrays or diagnostic work up done for these areas.   She states that all her providers have given her are "pain" medications including hydrocodone (which she couldn't tolerate). She has taken wellbutrin, celexa, cymbalta, and perhaps some stimulants as well.   She notes worsening problems with her memory over the last year or two. From a sleep standpoint, she has ups and downs.  she has been seen by pulmonology for insomnia. Her pain often awakens her at night.   Haley Reyes is the mother of 4 children, 3 girls and 1 boy. It has been increasingly hard to care for him.   Pain Inventory Average Pain 10 Pain Right Now 8 My pain is sharp and aching  In the last 24 hours, has pain interfered with the following? General activity 8 Relation with others 10 Enjoyment of life 10 What TIME of day is your pain at its worst? constant Sleep (in general) Poor  Pain is worse with: walking, bending, sitting, standing and some activites Pain improves with: rest, medication and injections Relief from Meds: n/a  Mobility do you drive?  yes  Function not employed: date last employed . I need assistance with the following:  meal prep, household duties and shopping  Neuro/Psych bladder control problems bowel control problems weakness numbness trouble walking spasms confusion depression anxiety  Prior Studies Any changes since last visit?  no  Physicians  involved in your care Primary care . Psychiatrist . Psychologist .   Family History  Problem Relation Age of Onset  . Lung cancer Father   . Hypertension Father   . COPD Maternal Grandmother   . Congestive Heart Failure Maternal Grandmother   . Allergies Other     children x 4  . Asthma Other     children x 4  . Diabetes Other     fathers siblings  . Allergies Brother   . Congestive Heart Failure Other     aunt  . Sleep apnea Brother   . Hypertension Mother    History   Social History  . Marital Status: Single    Spouse Name: N/A    Number of Children: 4  . Years of Education: N/A   Occupational History  . unemployed    Social History Main Topics  . Smoking status: Current Every Day Smoker -- 1.00 packs/day for 10 years    Types: Cigarettes  . Smokeless tobacco: None  . Alcohol Use: Yes     Comment: glass of red wine occassionally during the week. None at the time d/t new medications.   . Drug Use: No  . Sexual Activity: None   Other Topics Concern  . None   Social History Narrative  . None   Past Surgical History  Procedure Laterality Date  . Cholecystectomy  2000  . Cystic fibroids    . Tubal ligation     Past Medical History  Diagnosis Date  .  Fibromyalgia   . Insomnia   . Anxiety   . Angina at rest    BP 96/60  Pulse 89  Resp 14  Ht 5\' 1"  (1.549 m)  Wt 127 lb (57.607 kg)  BMI 24.01 kg/m2  SpO2 98%  LMP 07/01/2013     Review of Systems  Constitutional: Positive for diaphoresis and unexpected weight change.  Respiratory: Positive for apnea and cough.   Gastrointestinal: Positive for vomiting, abdominal pain and diarrhea.  Musculoskeletal: Positive for back pain, gait problem and neck pain.  Neurological: Positive for weakness and numbness.  Psychiatric/Behavioral: Positive for confusion and dysphoric mood. The patient is nervous/anxious.   All other systems reviewed and are negative.       Objective:   Physical Exam  General:  Alert and oriented x 3, No apparent distress HEENT: Head is normocephalic, atraumatic, PERRLA, EOMI, sclera anicteric, oral mucosa pink and moist, dentition intact, ext ear canals clear,  Neck: Supple without JVD or lymphadenopathy Heart: Reg rate and rhythm. No murmurs rubs or gallops Chest: CTA bilaterally without wheezes, rales, or rhonchi; no distress Abdomen: Soft, non-tender, non-distended, bowel sounds positive. Extremities: No clubbing, cyanosis, or edema. Pulses are 2+ Skin: Clean and intact without signs of breakdown. She has numerous tattoos.  Neuro: Pt is cognitively appropriate with normal insight, memory, and awareness. Cranial nerves 2-12 are intact. Sensory exam is notable for decreaed PP and LT below the mid forearm and also below the knees bilaterally. Reflexes are 2+ in all 4's. Fine motor coordination is intact. No tremors. Motor function is grossly 5/5 but effort was inconsistent.  Musculoskeletal: she has diffuse tenderness on numerous bilateral bony prominences. Her back and neck ROM are limited in all pains. SLR was equivocal to positive. She doesn't walk with gross antalgia. She has palpation tenderness in her lumbar spine. Right shoulder with tenderness in IR and with impingement maneuvers. Both knees had tenderness with medial and lateral meniscus provocation. No gross instability was seen. Minimal swelling was appreciated at both knees  Psych: Pt's affect is depressed, she sometimes cried during the visit. Pt is cooperative        Assessment & Plan:  1. Fibromyalgia-- I have absolutely no information, other than the patient's accounts, to go on today, but the diagnosis does appear to be at least part of her problems. 2. Bilateral knee pain--potentially OA 3. Right shoulder pain, DJD---mild RTC tendonitis 4. Insomnia with chronic fatigue 5. ?Depression 6. ?Peripheral neuropathy    Plan: 1. Xrays of bilateral knees and right shoulder tp assess joint spaces.  2.  Begin a trial of gabapentin 100mg  TID  3. Begin a trial of elavil 10mg  qhs for pain and sleep 4. Lab work needs to be done, but I don't want to repeat anything she's done recently over the last few months 5. Consider NCS, EMG and further work up for sensory loss in distal limbs. However, i have seen FMS masquerade as a peripheral neuropathy in the past. She has no obvious history which would suggest a source for this. Need some more labwork as well. 6. Will follow up with me in about one month's time. 45 minutes of face to face patient care time were spent during this visit. All questions were encouraged and answered.

## 2013-08-04 ENCOUNTER — Telehealth: Payer: Self-pay

## 2013-08-04 ENCOUNTER — Telehealth: Payer: Self-pay | Admitting: Physical Medicine & Rehabilitation

## 2013-08-04 NOTE — Telephone Encounter (Signed)
Patient has questions regarding her first visit at the clinic. She would like someone to return her call.

## 2013-08-04 NOTE — Telephone Encounter (Signed)
Patient had questions about diagnosis that were put on her after visit summary.  Questions clarified and answered.

## 2013-08-04 NOTE — Telephone Encounter (Signed)
UDS is consistent.

## 2013-08-06 ENCOUNTER — Telehealth: Payer: Self-pay

## 2013-08-06 NOTE — Telephone Encounter (Signed)
Patient called and stated she has some questions about a issue she has been having for a while. She wants advise of what to do about the problem.

## 2013-08-06 NOTE — Telephone Encounter (Signed)
Patient is just wondering if she is having trouble urinating what should she do.  Advised patient to contact pcp or urologist.  She also has problems with her bones feel like they are grinding or popping.  Advised patient to increase H2O and try glucosamine.  Patient has orders for xrays she has not had done yet.  Reccommended she have xrays done to give [place] an idea whats going on then she can discuss options at next appointment.  Patient agreed.

## 2013-08-07 ENCOUNTER — Ambulatory Visit
Admission: RE | Admit: 2013-08-07 | Discharge: 2013-08-07 | Disposition: A | Discharge status: Home / Self Care | Payer: Medicaid Other | Source: Ambulatory Visit | Attending: Physical Medicine & Rehabilitation | Admitting: Physical Medicine & Rehabilitation

## 2013-08-07 DIAGNOSIS — M170 Bilateral primary osteoarthritis of knee: Secondary | ICD-10-CM

## 2013-08-07 DIAGNOSIS — M7581 Other shoulder lesions, right shoulder: Secondary | ICD-10-CM

## 2013-08-08 NOTE — Telephone Encounter (Signed)
Please inform mrs. Ishii that xrays of knees and right shoulder all look normal. thanks

## 2013-08-11 NOTE — Telephone Encounter (Signed)
Patient informed of normal knee and shoulder xray.

## 2013-08-19 ENCOUNTER — Telehealth: Payer: Self-pay

## 2013-08-19 NOTE — Telephone Encounter (Signed)
Patient called to see if there is something she can do for her bones grinding, cracking, popping.  She would like to know how to get it to stop.  Please advise.

## 2013-08-19 NOTE — Telephone Encounter (Signed)
Glucosamine with chondroitin might be worth a trial----OTC

## 2013-08-20 NOTE — Telephone Encounter (Signed)
Advised patient to try glucosamine with chondroitin.

## 2013-08-22 ENCOUNTER — Ambulatory Visit (INDEPENDENT_AMBULATORY_CARE_PROVIDER_SITE_OTHER): Payer: Medicaid Other | Admitting: Pulmonary Disease

## 2013-08-22 ENCOUNTER — Encounter: Payer: Self-pay | Admitting: Pulmonary Disease

## 2013-08-22 VITALS — BP 118/74 | HR 77 | Temp 98.2°F | Ht 61.0 in | Wt 126.4 lb

## 2013-08-22 DIAGNOSIS — G478 Other sleep disorders: Secondary | ICD-10-CM

## 2013-08-22 DIAGNOSIS — G4700 Insomnia, unspecified: Secondary | ICD-10-CM

## 2013-08-22 DIAGNOSIS — F518 Other sleep disorders not due to a substance or known physiological condition: Secondary | ICD-10-CM

## 2013-08-22 DIAGNOSIS — Z72821 Inadequate sleep hygiene: Secondary | ICD-10-CM

## 2013-08-22 DIAGNOSIS — F5104 Psychophysiologic insomnia: Secondary | ICD-10-CM

## 2013-08-22 NOTE — Patient Instructions (Signed)
Will schedule for a sleep study to see if there is an underlying sleep disorder causing your insomnia.  For the few days leading up to the sleep study, try to stay on a regular schedule and no napping leading up to the study (so you can sleep during the study). If your sleep study is normal, you will need to consider referral to a behavioral therapist for cognitive behavioral therapy. Continue to work on the things we discussed last visit.

## 2013-08-22 NOTE — Assessment & Plan Note (Signed)
The patient continues to have issues with sleep onset and maintenance, however she has really not stuck with good sleep hygiene and stimulus control therapy as we discussed at the last visit. She is very concerned about her sleepiness during the day, and feels that she may have some type of abnormal sleep issue. We had discussed the possibility of sleep apnea and periodic limb movement disorder, but I felt the patient was at low risk given her body habitus and lack of history. She now tells me that someone told her she had sleep apnea, but it does not sound like she had a home sleep test but rather a Holter monitor. She has never had a formal sleep study. At this point, I will schedule her for a sleep study to see if there is a sleep disorder that is causing her issues. In the meantime, I've asked her to continue with good sleep hygiene and stimulus control therapy. If her sleep study is normal, she will need cognitive behavioral therapy with a mental health professional.

## 2013-08-22 NOTE — Progress Notes (Signed)
   Subjective:    Patient ID: Haley Reyes, female    DOB: 18-Aug-1976, 37 y.o.   MRN: [id number]  HPI The patient comes in today for followup of her insomnia. At the last visit, I felt this was secondary to inadequate sleep hygiene and psychophysiologic insomnia. We discussed what constitutes good sleep hygiene, and I also described stimulus control therapy. The patient was supposed to call me in 3 weeks, and never did so. She really has not tried the sleep hygiene practices or stimulus control as described last visit. She continues to have significant sleep disruption and naps quite a bit during the day. She is concerned about her significant daytime sleepiness. She also is complaining about a chronic pain syndrome that she feels interferes with her sleep at night as well. Finally, she tells me that someone told her she had sleep apnea, but she has never had a formal sleep study.   Review of Systems  Constitutional: Negative for fever and unexpected weight change.  HENT: Negative for congestion, dental problem, ear pain, nosebleeds, postnasal drip, rhinorrhea, sinus pressure, sneezing, sore throat and trouble swallowing.   Eyes: Negative for redness and itching.  Respiratory: Positive for wheezing. Negative for cough, chest tightness and shortness of breath.   Cardiovascular: Negative for palpitations and leg swelling.  Gastrointestinal: Negative for nausea and vomiting.  Genitourinary: Negative for dysuria.  Musculoskeletal: Negative for joint swelling.  Skin: Negative for rash.  Neurological: Negative for headaches.  Hematological: Does not bruise/bleed easily.  Psychiatric/Behavioral: Positive for dysphoric mood. The patient is not nervous/anxious.        Objective:   Physical Exam Thin female in no acute distress Nose without purulence or discharge noted Neck without lymphadenopathy or thyromegaly Lower extremities without edema, no cyanosis Awake, but appears to be sleepy, moves  all 4 extremities.       Assessment & Plan:

## 2013-09-02 ENCOUNTER — Telehealth: Payer: Self-pay

## 2013-09-02 ENCOUNTER — Encounter: Payer: Medicaid Other | Admitting: Physical Medicine & Rehabilitation

## 2013-09-02 NOTE — Telephone Encounter (Signed)
Patient called with concerns about her medication and her pain.  She is not taking her medications as directed.  She is unable to come to her appointment today because of illness and having her grandchildren.  Advised patient to take medication as directed.  She was concerned about interaction between her medications.  She is taking more medication than we have on record.  Advised her to contact pharmacist for medication interactions and asked her to bring all of her medications to next visit.

## 2013-09-10 ENCOUNTER — Encounter: Payer: Medicaid Other | Attending: Physical Medicine & Rehabilitation | Admitting: Physical Medicine & Rehabilitation

## 2013-09-10 ENCOUNTER — Encounter: Payer: Self-pay | Admitting: Physical Medicine & Rehabilitation

## 2013-09-10 VITALS — BP 121/73 | HR 87 | Resp 14 | Ht 61.0 in | Wt 124.0 lb

## 2013-09-10 DIAGNOSIS — F3289 Other specified depressive episodes: Secondary | ICD-10-CM | POA: Insufficient documentation

## 2013-09-10 DIAGNOSIS — IMO0002 Reserved for concepts with insufficient information to code with codable children: Secondary | ICD-10-CM

## 2013-09-10 DIAGNOSIS — M719 Bursopathy, unspecified: Secondary | ICD-10-CM

## 2013-09-10 DIAGNOSIS — M170 Bilateral primary osteoarthritis of knee: Secondary | ICD-10-CM

## 2013-09-10 DIAGNOSIS — M67919 Unspecified disorder of synovium and tendon, unspecified shoulder: Secondary | ICD-10-CM

## 2013-09-10 DIAGNOSIS — M797 Fibromyalgia: Secondary | ICD-10-CM

## 2013-09-10 DIAGNOSIS — IMO0001 Reserved for inherently not codable concepts without codable children: Secondary | ICD-10-CM | POA: Insufficient documentation

## 2013-09-10 DIAGNOSIS — F329 Major depressive disorder, single episode, unspecified: Secondary | ICD-10-CM | POA: Insufficient documentation

## 2013-09-10 DIAGNOSIS — M1710 Unilateral primary osteoarthritis, unspecified knee: Secondary | ICD-10-CM

## 2013-09-10 DIAGNOSIS — M7581 Other shoulder lesions, right shoulder: Secondary | ICD-10-CM

## 2013-09-10 DIAGNOSIS — F32A Depression, unspecified: Secondary | ICD-10-CM

## 2013-09-10 MED ORDER — AMITRIPTYLINE HCL 25 MG PO TABS: 25.0000 mg | ORAL_TABLET | Freq: Every day | ORAL | Status: DC

## 2013-09-10 MED ORDER — CITALOPRAM HYDROBROMIDE 10 MG PO TABS: 10.0000 mg | ORAL_TABLET | Freq: Every day | ORAL | Status: DC

## 2013-09-10 NOTE — Progress Notes (Signed)
Subjective:    Patient ID: Haley Reyes, female    DOB: 1975-10-18, 38 y.o.   MRN: [id number]  HPI  Haley Reyes is back regarding her chronic pain. I ordered xrays of her knees and right shoulder at last visit which were completely clean. We also initiated gabapentin and elavil at last visit. She is not taking these medications because of the cost factor. She is aggravated by the fact that she can't do what she did before. She is used to being active. Her perceived loss of activity is difficult for her. She feels depressed and anxious as well. Her sleep remains a factor.      Pain Inventory Average Pain 9 Pain Right Now 8 My pain is sharp, stabbing, tingling and aching  In the last 24 hours, has pain interfered with the following? General activity 10 Relation with others 9 Enjoyment of life 10 What TIME of day is your pain at its worst? all day Sleep (in general) Poor  Pain is worse with: walking, bending, sitting, standing and some activites Pain improves with: rest, medication and injections Relief from Meds: 2  Mobility walk without assistance how many minutes can you walk? not long ability to climb steps?  yes do you drive?  yes  Function I need assistance with the following:  dressing, bathing, toileting, meal prep, household duties and shopping  Neuro/Psych bladder control problems bowel control problems weakness numbness tingling trouble walking spasms confusion depression anxiety  Prior Studies x-rays  Physicians involved in your care Any changes since last visit?  no   Family History  Problem Relation Age of Onset  . Lung cancer Father   . Hypertension Father   . COPD Maternal Grandmother   . Congestive Heart Failure Maternal Grandmother   . Allergies Other     children x 4  . Asthma Other     children x 4  . Diabetes Other     fathers siblings  . Allergies Brother   . Congestive Heart Failure Other     aunt  . Sleep apnea Brother   .  Hypertension Mother    History   Social History  . Marital Status: Single    Spouse Name: N/A    Number of Children: 4  . Years of Education: N/A   Occupational History  . unemployed    Social History Main Topics  . Smoking status: Current Every Day Smoker -- 0.75 packs/day    Types: Cigarettes    Start date: 09/04/1998  . Smokeless tobacco: None  . Alcohol Use: Yes     Comment: glass of red wine occassionally during the week. None at the time d/t new medications.   . Drug Use: No  . Sexual Activity: None   Other Topics Concern  . None   Social History Narrative  . None   Past Surgical History  Procedure Laterality Date  . Cholecystectomy  2000  . Cystic fibroids    . Tubal ligation     Past Medical History  Diagnosis Date  . Fibromyalgia   . Insomnia   . Anxiety   . Angina at rest    BP 121/73  Pulse 87  Resp 14  Ht 5\' 1"  (1.549 m)  Wt 124 lb (56.246 kg)  BMI 23.44 kg/m2  SpO2 100%     Review of Systems  Constitutional: Positive for unexpected weight change.  Respiratory: Positive for apnea.   Gastrointestinal: Positive for abdominal pain, diarrhea and constipation.  Musculoskeletal: Positive for arthralgias, back pain, gait problem and myalgias.  Neurological: Positive for weakness and numbness.  Psychiatric/Behavioral: Positive for confusion and dysphoric mood. The patient is nervous/anxious.   All other systems reviewed and are negative.       Objective:   Physical Exam General: Alert and oriented x 3, No apparent distress  HEENT: Head is normocephalic, atraumatic, PERRLA, EOMI, sclera anicteric, oral mucosa pink and moist, dentition intact, ext ear canals clear,  Neck: Supple without JVD or lymphadenopathy  Heart: Reg rate and rhythm. No murmurs rubs or gallops  Chest: CTA bilaterally without wheezes, rales, or rhonchi; no distress  Abdomen: Soft, non-tender, non-distended, bowel sounds positive.  Extremities: No clubbing, cyanosis, or  edema. Pulses are 2+  Skin: Clean and intact without signs of breakdown. She has numerous tattoos.  Neuro: Pt is cognitively appropriate with normal insight, memory, and awareness. Cranial nerves 2-12 are intact. Sensory exam is notable for decreaed PP and LT below the mid forearm and also below the knees bilaterally. Reflexes are 2+ in all 4's. Fine motor coordination is intact. No tremors. Motor function is grossly 5/5 but effort was inconsistent.  Musculoskeletal: she has diffuse tenderness on numerous bilateral bony prominences. Her back and neck ROM are limited in all pains. SLR was equivocal to positive. She doesn't walk with gross antalgia. She has palpation tenderness in her lumbar spine. Right shoulder with tenderness in IR and with impingement maneuvers. Both knees had tenderness with medial and lateral meniscus provocation although they were grossly tender just with simple palpation. No gross instability was seen. No swelling was appreciated at both knees  Psych: Pt's affect is depressed, she sometimes cried during the visit once again tody.   Assessment & Plan:   1. Fibromyalgia--  2. Bilateral knee pain. Xrays negative. 3. Right shoulder pain, DJD---mild RTC tendonitis? xrays negative 4. Insomnia with chronic fatigue  5. Depression and anxiety 6. ?Peripheral neuropathy    Plan:  1. Discussed at length the effects of FMS as they pertain to her pain, mood, sleep, etc.   2. Begin a trial of gabapentin 100mg  TID at spme point, financially permitting 3. Will retrial of elavil 10mg  qhs for pain and sleep  4. Trial of celexa 10mg  qhs to help with mood. However, after I handed the patient this prescription she told me she recognized it and it made her hair fall out----therefore, she won't use!! 5. Consider NCS, EMG and further work up for sensory loss in distal limbs potentially 6. Will follow up with me in about 63m onth's time. 30 minutes of face to face patient care time were spent during  this visit. All questions were encouraged and answered.

## 2013-09-10 NOTE — Patient Instructions (Signed)
AEROBIC EXERCISE AT LEAST 20 MINUTES PER DAY.

## 2013-09-11 ENCOUNTER — Telehealth: Payer: Self-pay

## 2013-09-11 NOTE — Telephone Encounter (Signed)
Patient called complaining of weakness in her right hand.  She has no strength and it hurst's.  She was wondering if she could get a brace for this or any other suggestions.  Please advise.

## 2013-09-12 NOTE — Telephone Encounter (Signed)
Recommend $10 wrist splint (with rigid underside--not a neoprene sleeve) from drug store to support wrist--wear at night and prn during the day

## 2013-09-12 NOTE — Telephone Encounter (Signed)
Patient advised to purchase a brace with rigid underside and wear at night and prn.

## 2013-09-26 ENCOUNTER — Ambulatory Visit (HOSPITAL_BASED_OUTPATIENT_CLINIC_OR_DEPARTMENT_OTHER): Payer: Medicaid Other | Attending: Pulmonary Disease | Admitting: Radiology

## 2013-09-26 VITALS — Ht 61.0 in | Wt 126.0 lb

## 2013-09-26 DIAGNOSIS — G4730 Sleep apnea, unspecified: Principal | ICD-10-CM

## 2013-09-26 DIAGNOSIS — G4733 Obstructive sleep apnea (adult) (pediatric): Secondary | ICD-10-CM

## 2013-09-26 DIAGNOSIS — IMO0002 Reserved for concepts with insufficient information to code with codable children: Secondary | ICD-10-CM | POA: Insufficient documentation

## 2013-09-26 DIAGNOSIS — G4710 Hypersomnia, unspecified: Secondary | ICD-10-CM | POA: Insufficient documentation

## 2013-09-26 DIAGNOSIS — G478 Other sleep disorders: Secondary | ICD-10-CM

## 2013-09-30 ENCOUNTER — Telehealth: Payer: Self-pay | Admitting: Pulmonary Disease

## 2013-09-30 NOTE — Telephone Encounter (Signed)
[facility] we will [facility].  [facility] please advise on sleep study results. Thanks.

## 2013-09-30 NOTE — Telephone Encounter (Signed)
Study was just done 3 days ago.  Let her know will call once this is done.  Hopefully this week.

## 2013-10-01 NOTE — Telephone Encounter (Signed)
Pt advised. Bentzion Dauria, CMA  

## 2013-10-02 ENCOUNTER — Telehealth: Payer: Self-pay

## 2013-10-02 NOTE — Telephone Encounter (Signed)
Patient is requesting a brace for her right hand. She states she is having weakness and continuously dropping things with that hand.

## 2013-10-03 NOTE — Telephone Encounter (Signed)
Attempted to contact patient and # is disconnected.

## 2013-10-03 NOTE — Telephone Encounter (Signed)
See 09/12/13 documentation from me

## 2013-10-04 DIAGNOSIS — G478 Other sleep disorders: Secondary | ICD-10-CM

## 2013-10-04 DIAGNOSIS — G4733 Obstructive sleep apnea (adult) (pediatric): Secondary | ICD-10-CM

## 2013-10-04 NOTE — Sleep Study (Signed)
   NAME: Haley PostSonya B Fishburn DATE OF BIRTH:  05-07-1976 MEDICAL RECORD NUMBER [id number]  LOCATION: [facility]  PHYSICIAN: Barbaraann ShareCLANCE,KEITH M  DATE OF STUDY: 09/26/2013  SLEEP STUDY TYPE: Nocturnal Polysomnogram               REFERRING PHYSICIAN: Clance, Maree KrabbeKeith M, MD  INDICATION FOR STUDY: Hypersomnia with sleep apnea  EPWORTH SLEEPINESS SCORE:  23 HEIGHT: 5\' 1"  (154.9 cm)  WEIGHT: 126 lb (57.153 kg)    Body mass index is 23.82 kg/(m^2).  NECK SIZE: 12.5 in.  MEDICATIONS: Reviewed and sleep chart  SLEEP ARCHITECTURE: The patient had a total sleep time of 298 minutes, with very little slow-wave sleep and only 45 minutes of REM. Sleep onset latency was mildly prolonged at 47 minutes, and REM onset was normal at 77 minutes. Sleep efficiency was moderately reduced at 77%.   RESPIRATORY DATA:  the patient was found to have 1 apnea and 2 obstructive hypopneas, giving her an AHI of only 0.6 events per hour. The events occurred in all body positions, and there was moderate snoring noted throughout.   OXYGEN DATA:  there was oxygen desaturation as low as 90% with the patient's obstructive events   CARDIAC DATA: No clinically significant arrhythmias were seen  MOVEMENT/PARASOMNIA:  there were no leg jerks or other abnormal behaviors noted.   IMPRESSION/ RECOMMENDATION:    1)  small numbers of obstructive events which do not meet the AHI criteria for the obstructive sleep apnea syndrome. The patient has a history of insomnia, however slept 298 minutes with only minimal delayed sleep onset. The patient's symptoms of hypersomnia are not explained by this sleep study. Clinical correlation is suggested.      Barbaraann ShareLANCE,KEITH M Diplomate, American Board of Sleep Medicine  ELECTRONICALLY SIGNED ON:  10/04/2013, 4:46 PM [facility] PH: [phone]   FX: [phone]) [phone] ACCREDITED BY THE AMERICAN ACADEMY OF SLEEP MEDICINE

## 2013-10-06 ENCOUNTER — Telehealth: Payer: Self-pay | Admitting: Pulmonary Disease

## 2013-10-06 NOTE — Telephone Encounter (Signed)
Please let pt know that her sleep study does not show sleep apnea, or other sleep disorder that can disrupt sleep.  I really think she needs to work on her sleep hygiene.  She cannot sleep during the day at all, and needs to work on the behavioral treatments that we have discussed in the past.  She also needs to get her pain controlled at night if she feels this is disrupting her sleep as well.  Do not see a sleep disorder that is causing her symptoms.

## 2013-10-07 NOTE — Telephone Encounter (Signed)
ATC x 1 Message stating "the person called is not available, call back later"

## 2013-10-14 NOTE — Telephone Encounter (Signed)
i called and spoke with pt. Aware of results. Nothing further needed

## 2013-10-16 ENCOUNTER — Telehealth: Payer: Self-pay

## 2013-10-16 NOTE — Telephone Encounter (Signed)
Have her come in for a cancellation when that occurs

## 2013-10-16 NOTE — Telephone Encounter (Signed)
Patient called requesting injections for her knee and shoulder.  Tried to schedule an earlier appointment but non available.  Advised patient to exercise, stretch use heat and ice.  She will try this.  Any other suggestions.

## 2013-10-17 NOTE — Telephone Encounter (Signed)
Contacted patient to inform her that she will be put on the cancellation list.

## 2013-11-07 ENCOUNTER — Encounter: Payer: Medicaid Other | Admitting: Physical Medicine & Rehabilitation

## 2013-11-14 ENCOUNTER — Encounter: Payer: Medicaid Other | Attending: Physical Medicine & Rehabilitation | Admitting: Physical Medicine & Rehabilitation

## 2013-11-14 DIAGNOSIS — F329 Major depressive disorder, single episode, unspecified: Secondary | ICD-10-CM | POA: Insufficient documentation

## 2013-11-14 DIAGNOSIS — IMO0001 Reserved for inherently not codable concepts without codable children: Secondary | ICD-10-CM | POA: Insufficient documentation

## 2013-11-14 DIAGNOSIS — F3289 Other specified depressive episodes: Secondary | ICD-10-CM | POA: Insufficient documentation

## 2013-12-29 ENCOUNTER — Telehealth: Payer: Self-pay

## 2013-12-29 DIAGNOSIS — F329 Major depressive disorder, single episode, unspecified: Secondary | ICD-10-CM

## 2013-12-29 DIAGNOSIS — F32A Depression, unspecified: Secondary | ICD-10-CM

## 2013-12-29 DIAGNOSIS — Z5181 Encounter for therapeutic drug level monitoring: Secondary | ICD-10-CM

## 2013-12-29 DIAGNOSIS — M549 Dorsalgia, unspecified: Secondary | ICD-10-CM

## 2013-12-29 DIAGNOSIS — M542 Cervicalgia: Secondary | ICD-10-CM

## 2013-12-29 DIAGNOSIS — M797 Fibromyalgia: Secondary | ICD-10-CM

## 2013-12-29 DIAGNOSIS — Z79899 Other long term (current) drug therapy: Secondary | ICD-10-CM

## 2013-12-29 NOTE — Telephone Encounter (Signed)
Patient called requesting medication refills.  She did not specify which medications.  Left message advising patient to call office and let us know which medications were needed.

## 2013-12-30 MED ORDER — MOBIC 15 MG PO TABS: 15.0000 mg | ORAL_TABLET | Freq: Every day | ORAL | Status: DC

## 2013-12-30 MED ORDER — GABAPENTIN 100 MG PO CAPS: 100.0000 mg | ORAL_CAPSULE | Freq: Three times a day (TID) | ORAL | Status: DC

## 2013-12-30 MED ORDER — CITALOPRAM HYDROBROMIDE 10 MG PO TABS: 10.0000 mg | ORAL_TABLET | Freq: Every day | ORAL | Status: DC

## 2013-12-30 MED ORDER — AMITRIPTYLINE HCL 25 MG PO TABS: 25.0000 mg | ORAL_TABLET | Freq: Every day | ORAL | Status: DC

## 2013-12-30 NOTE — Telephone Encounter (Signed)
May refill all of these

## 2013-12-30 NOTE — Telephone Encounter (Signed)
Celexa, Amitriptyline, gabapentin, and Mobic e scribed to the pharmacy per Dr. Riley KillSwartz. Left patient a voicemail to inform her the refills she requested was sent to the pharmacy.

## 2013-12-30 NOTE — Telephone Encounter (Signed)
Patient is requesting a refill on Celexa, Amitriptyline, gabapentin, and Mobic. Patient was last seen 09/10/13. Next appt is 6/10. Please advise.

## 2013-12-31 ENCOUNTER — Ambulatory Visit: Payer: Medicaid Other | Admitting: Physical Medicine & Rehabilitation

## 2014-02-11 ENCOUNTER — Encounter: Payer: Medicaid Other | Admitting: Physical Medicine & Rehabilitation

## 2014-02-20 ENCOUNTER — Telehealth: Payer: Self-pay | Admitting: *Deleted

## 2014-02-20 NOTE — Telephone Encounter (Signed)
Per Dr Riley KillSwartz we are discharging Ms Haley Reyes from the practice due to frequent cancellations, and no shows. Most were cancelled a short time before apptointments. I called and spoke with Ms Haley Reyes and she was denying that she has done this.  I pointed out that from October 2014 she had scheduled 8 appointments and showed up for only two of them.  A letter will be mailed to her with surrounding pain clinics.

## 2014-03-17 ENCOUNTER — Ambulatory Visit: Payer: Medicaid Other | Admitting: Physical Medicine & Rehabilitation

## 2015-07-20 ENCOUNTER — Emergency Department (HOSPITAL_COMMUNITY): Payer: Medicaid Other

## 2015-07-20 ENCOUNTER — Emergency Department (HOSPITAL_COMMUNITY)
Admission: EM | Admit: 2015-07-20 | Discharge: 2015-07-20 | Disposition: A | Discharge status: Home / Self Care | Payer: Medicaid Other | Attending: Emergency Medicine | Admitting: Emergency Medicine

## 2015-07-20 ENCOUNTER — Encounter (HOSPITAL_COMMUNITY): Payer: Self-pay | Admitting: Emergency Medicine

## 2015-07-20 DIAGNOSIS — F17210 Nicotine dependence, cigarettes, uncomplicated: Secondary | ICD-10-CM | POA: Insufficient documentation

## 2015-07-20 DIAGNOSIS — R079 Chest pain, unspecified: Secondary | ICD-10-CM | POA: Insufficient documentation

## 2015-07-20 DIAGNOSIS — N898 Other specified noninflammatory disorders of vagina: Secondary | ICD-10-CM | POA: Diagnosis not present

## 2015-07-20 DIAGNOSIS — R109 Unspecified abdominal pain: Secondary | ICD-10-CM | POA: Insufficient documentation

## 2015-07-20 DIAGNOSIS — Z79899 Other long term (current) drug therapy: Secondary | ICD-10-CM | POA: Insufficient documentation

## 2015-07-20 DIAGNOSIS — Z8679 Personal history of other diseases of the circulatory system: Secondary | ICD-10-CM | POA: Insufficient documentation

## 2015-07-20 DIAGNOSIS — G4700 Insomnia, unspecified: Secondary | ICD-10-CM | POA: Insufficient documentation

## 2015-07-20 DIAGNOSIS — F419 Anxiety disorder, unspecified: Secondary | ICD-10-CM | POA: Insufficient documentation

## 2015-07-20 DIAGNOSIS — Z91040 Latex allergy status: Secondary | ICD-10-CM | POA: Diagnosis not present

## 2015-07-20 DIAGNOSIS — M797 Fibromyalgia: Secondary | ICD-10-CM | POA: Diagnosis not present

## 2015-07-20 DIAGNOSIS — R52 Pain, unspecified: Secondary | ICD-10-CM

## 2015-07-20 LAB — URINALYSIS, ROUTINE W REFLEX MICROSCOPIC
BILIRUBIN URINE: NEGATIVE
Glucose, UA: NEGATIVE mg/dL
HGB URINE DIPSTICK: NEGATIVE
Ketones, ur: NEGATIVE mg/dL
Leukocytes, UA: NEGATIVE
NITRITE: NEGATIVE
PROTEIN: NEGATIVE mg/dL
SPECIFIC GRAVITY, URINE: 1.024 (ref 1.005–1.030)
pH: 6 (ref 5.0–8.0)

## 2015-07-20 LAB — HEPATIC FUNCTION PANEL
ALBUMIN: 4.9 g/dL (ref 3.5–5.0)
ALT: 18 U/L (ref 14–54)
AST: 20 U/L (ref 15–41)
Alkaline Phosphatase: 35 U/L — ABNORMAL LOW (ref 38–126)
BILIRUBIN DIRECT: 0.1 mg/dL (ref 0.1–0.5)
BILIRUBIN TOTAL: 0.5 mg/dL (ref 0.3–1.2)
Indirect Bilirubin: 0.4 mg/dL (ref 0.3–0.9)
Total Protein: 8.5 g/dL — ABNORMAL HIGH (ref 6.5–8.1)

## 2015-07-20 LAB — BASIC METABOLIC PANEL
Anion gap: 7 (ref 5–15)
BUN: 12 mg/dL (ref 6–20)
CHLORIDE: 105 mmol/L (ref 101–111)
CO2: 26 mmol/L (ref 22–32)
Calcium: 9.5 mg/dL (ref 8.9–10.3)
Creatinine, Ser: 0.9 mg/dL (ref 0.44–1.00)
Glucose, Bld: 89 mg/dL (ref 65–99)
POTASSIUM: 3.9 mmol/L (ref 3.5–5.1)
SODIUM: 138 mmol/L (ref 135–145)

## 2015-07-20 LAB — POC URINE PREG, ED: PREG TEST UR: NEGATIVE

## 2015-07-20 LAB — CBC
HEMATOCRIT: 43.3 % (ref 36.0–46.0)
Hemoglobin: 14.4 g/dL (ref 12.0–15.0)
MCH: 29.9 pg (ref 26.0–34.0)
MCHC: 33.3 g/dL (ref 30.0–36.0)
MCV: 90 fL (ref 78.0–100.0)
Platelets: 287 10*3/uL (ref 150–400)
RBC: 4.81 MIL/uL (ref 3.87–5.11)
RDW: 13 % (ref 11.5–15.5)
WBC: 6.7 10*3/uL (ref 4.0–10.5)

## 2015-07-20 LAB — I-STAT TROPONIN, ED: TROPONIN I, POC: 0 ng/mL (ref 0.00–0.08)

## 2015-07-20 LAB — LIPASE, BLOOD: LIPASE: 31 U/L (ref 11–51)

## 2015-07-20 NOTE — ED Provider Notes (Signed)
CSN: [id number]     Arrival date & time 07/20/15  1634 History   First MD Initiated Contact with Patient 07/20/15 1647     Chief Complaint  Patient presents with  . Chest Pain     (Consider location/radiation/quality/duration/timing/severity/associated sxs/prior Treatment) HPI Complains of anterior chest pain onset 8 AM today pain is intermittent lasting anywhere from 10 seconds to minutes at a time. Nonexertional. Nothing makes pain better or worse. No shortness of breath nausea or vomiting or sweatiness. Patient also reports her abdomen feels "bloated" her abdomen has felt bloated intermittently since her cholecystectomy a proximally 15 years ago. Occurs with eating certain foods, such as no products. Last bowel movement was yesterday, normal. She had denies urinary symptoms. Last normal menstrual period was October 28, shorter than normal. No other associated symptoms. She presents today for chest pain which is a new symptom. Past Medical History  Diagnosis Date  . Fibromyalgia   . Insomnia   . Anxiety   . Angina at rest [facility](HCC)    Past Surgical History  Procedure Laterality Date  . Cholecystectomy  2000  . Cystic fibroids    . Tubal ligation     Family History  Problem Relation Age of Onset  . Lung cancer Father   . Hypertension Father   . COPD Maternal Grandmother   . Congestive Heart Failure Maternal Grandmother   . Allergies Other     children x 4  . Asthma Other     children x 4  . Diabetes Other     fathers siblings  . Allergies Brother   . Congestive Heart Failure Other     aunt  . Sleep apnea Brother   . Hypertension Mother    Social History  Substance Use Topics  . Smoking status: Current Every Day Smoker -- 0.75 packs/day    Types: Cigarettes    Start date: 09/04/1998  . Smokeless tobacco: None  . Alcohol Use: Yes     Comment: glass of red wine occassionally during the week. None at the time d/t new medications.    OB History    No data available      Review of Systems  Constitutional: Negative.   HENT: Negative.   Respiratory: Negative.   Cardiovascular: Positive for chest pain.  Gastrointestinal: Positive for abdominal distention.       Admit and abdominal bloating for 15 years, unchanged  Genitourinary: Positive for vaginal discharge.       Chronic vaginal discharge, unchanged. Menses irregular  Musculoskeletal: Negative.   Skin: Negative.   Neurological: Negative.   Psychiatric/Behavioral: Negative.   All other systems reviewed and are negative.     Allergies  Benadryl; Codeine; Hydrocodone-ibuprofen; Latex; and Other  Home Medications   Prior to Admission medications   Medication Sig Start Date End Date Taking? Authorizing Provider  amitriptyline (ELAVIL) 25 MG tablet Take 1 tablet (25 mg total) by mouth at bedtime. 12/30/13   Ranelle OysterZachary T Swartz, MD  citalopram (CELEXA) 10 MG tablet Take 1 tablet (10 mg total) by mouth daily. 12/30/13   Ranelle OysterZachary T Swartz, MD  gabapentin (NEURONTIN) 100 MG capsule Take 1 capsule (100 mg total) by mouth 3 (three) times daily. 12/30/13   Ranelle OysterZachary T Swartz, MD  MOBIC 15 MG tablet Take 1 tablet (15 mg total) by mouth daily. 12/30/13   Ranelle OysterZachary T Swartz, MD  Multiple Vitamin (MULTIVITAMIN) capsule Take 1 capsule by mouth daily.    Historical Provider, MD   BP 106/59 mmHg  Pulse 92  Temp(Src) 98.1 F (36.7 C) (Oral)  Resp 22  SpO2 100%  LMP 07/06/2015 Physical Exam  Constitutional: She appears well-developed and well-nourished. No distress.  HENT:  Head: Normocephalic and atraumatic.  Eyes: Conjunctivae are normal. Pupils are equal, round, and reactive to light.  Neck: Neck supple. No tracheal deviation present. No thyromegaly present.  Cardiovascular: Normal rate and regular rhythm.   No murmur heard. Pulmonary/Chest: Effort normal and breath sounds normal.  Abdominal: Soft. Bowel sounds are normal. She exhibits no distension. There is no tenderness.  Musculoskeletal: Normal range of  motion. She exhibits no edema or tenderness.  Neurological: She is alert. Coordination normal.  Skin: Skin is warm and dry. No rash noted.  Psychiatric: She has a normal mood and affect.  Nursing note and vitals reviewed.   ED Course  Procedures (including critical care time) Labs Review Labs Reviewed  BASIC METABOLIC PANEL  CBC  I-STAT TROPOININ, ED    Imaging Review No results found. I have personally reviewed and evaluated these images and lab results as part of my medical decision-making.   EKG Interpretation   Date/Time:  Tuesday July 20 2015 16:40:27 EST Ventricular Rate:  85 PR Interval:  141 QRS Duration: 74 QT Interval:  346 QTC Calculation: 411 R Axis:   87 Text Interpretation:  Sinus rhythm Biatrial enlargement Anteroseptal  infarct, age indeterminate No significant change since last tracing  Confirmed by Ethelda Chick  MD, Danni Shima [phone]) on 07/20/2015 4:48:38 PM     7:40 PM patient resting comfortably. He has improved without treatment X-rays reviewed by me Results for orders placed or performed during the hospital encounter of 07/20/15  Basic metabolic panel  Result Value Ref Range   Sodium 138 135 - 145 mmol/L   Potassium 3.9 3.5 - 5.1 mmol/L   Chloride 105 101 - 111 mmol/L   CO2 26 22 - 32 mmol/L   Glucose, Bld 89 65 - 99 mg/dL   BUN 12 6 - 20 mg/dL   Creatinine, Ser 6.04 0.44 - 1.00 mg/dL   Calcium 9.5 8.9 - 54.0 mg/dL   GFR calc non Af Amer >60 >60 mL/min   GFR calc Af Amer >60 >60 mL/min   Anion gap 7 5 - 15  Hepatic function panel  Result Value Ref Range   Total Protein 8.5 (H) 6.5 - 8.1 g/dL   Albumin 4.9 3.5 - 5.0 g/dL   AST 20 15 - 41 U/L   ALT 18 14 - 54 U/L   Alkaline Phosphatase 35 (L) 38 - 126 U/L   Total Bilirubin 0.5 0.3 - 1.2 mg/dL   Bilirubin, Direct 0.1 0.1 - 0.5 mg/dL   Indirect Bilirubin 0.4 0.3 - 0.9 mg/dL  Lipase, blood  Result Value Ref Range   Lipase 31 11 - 51 U/L  Urinalysis, Routine w reflex microscopic (not at  [facility])  Result Value Ref Range   Color, Urine YELLOW YELLOW   APPearance CLEAR CLEAR   Specific Gravity, Urine 1.024 1.005 - 1.030   pH 6.0 5.0 - 8.0   Glucose, UA NEGATIVE NEGATIVE mg/dL   Hgb urine dipstick NEGATIVE NEGATIVE   Bilirubin Urine NEGATIVE NEGATIVE   Ketones, ur NEGATIVE NEGATIVE mg/dL   Protein, ur NEGATIVE NEGATIVE mg/dL   Nitrite NEGATIVE NEGATIVE   Leukocytes, UA NEGATIVE NEGATIVE  CBC  Result Value Ref Range   WBC 6.7 4.0 - 10.5 K/uL   RBC 4.81 3.87 - 5.11 MIL/uL   Hemoglobin 14.4 12.0 - 15.0  g/dL   HCT 09.8 11.9 - 14.7 %   MCV 90.0 78.0 - 100.0 fL   MCH 29.9 26.0 - 34.0 pg   MCHC 33.3 30.0 - 36.0 g/dL   RDW 82.9 56.2 - 13.0 %   Platelets 287 150 - 400 K/uL  I-stat troponin, ED (not at [facility])  Result Value Ref Range   Troponin i, poc 0.00 0.00 - 0.08 ng/mL   Comment 3          POC urine preg, ED (not at [facility])  Result Value Ref Range   Preg Test, Ur NEGATIVE NEGATIVE   Dg Chest 2 View  07/20/2015  CLINICAL DATA:  Chest pain starting this morning EXAM: CHEST  2 VIEW COMPARISON:  11/22/2008 FINDINGS: The heart size and mediastinal contours are within normal limits. Both lungs are clear. The visualized skeletal structures are unremarkable. IMPRESSION: No active cardiopulmonary disease. Electronically Signed   By: Natasha Mead M.D.   On: 07/20/2015 17:03   Dg Abd 2 Views  07/20/2015  CLINICAL DATA:  Sudden onset left-sided chest pain this afternoon. Shortness of breath. EXAM: ABDOMEN - 2 VIEW COMPARISON:  None. FINDINGS: Bowel gas pattern is nonobstructive. Surgical clips over the right upper quadrant. No free peritoneal air. No mass or mass effect. Several pelvic phleboliths. Remaining bony structures are normal. IMPRESSION: Nonobstructive bowel gas pattern. Electronically Signed   By: Elberta Fortis M.D.   On: 07/20/2015 18:31    MDM  I counseled patient for 5 minutes on smoking cessation Strongly doubt acute coronary syndrome. Highly atypical symptoms. Heart  score was 1 based on her only cardiac risk factor of smoking Abdominal pain is chronic Plan follow-up with PMD Diagnosis #1 atypical chest pain #2 nonspecific abdominal pain Final diagnoses:  None        Doug Sou, MD 07/20/15 1946

## 2015-07-20 NOTE — ED Notes (Signed)
MD at bedside.RN to draw labs

## 2015-07-20 NOTE — ED Notes (Signed)
Patient transported to X-ray 

## 2015-07-20 NOTE — Discharge Instructions (Signed)
Nonspecific Chest Pain  Contact your primary care physician tomorrow about today's visit here. Ask him/her to help you to stop smoking. Return if your condition worsens for any reason It is often hard to find the cause of chest pain. There is always a chance that your pain could be related to something serious, such as a heart attack or a blood clot in your lungs. Chest pain can also be caused by conditions that are not life-threatening. If you have chest pain, it is very important to follow up with your doctor.  HOME CARE  If you were prescribed an antibiotic medicine, finish it all even if you start to feel better.  Avoid any activities that cause chest pain.  Do not use any tobacco products, including cigarettes, chewing tobacco, or electronic cigarettes. If you need help quitting, ask your doctor.  Do not drink alcohol.  Take medicines only as told by your doctor.  Keep all follow-up visits as told by your doctor. This is important. This includes any further testing if your chest pain does not go away.  Your doctor may tell you to keep your head raised (elevated) while you sleep.  Make lifestyle changes as told by your doctor. These may include:  Getting regular exercise. Ask your doctor to suggest some activities that are safe for you.  Eating a heart-healthy diet. Your doctor or a diet specialist (dietitian) can help you to learn healthy eating options.  Maintaining a healthy weight.  Managing diabetes, if necessary.  Reducing stress. GET HELP IF:  Your chest pain does not go away, even after treatment.  You have a rash with blisters on your chest.  You have a fever. GET HELP RIGHT AWAY IF:  Your chest pain is worse.  You have an increasing cough, or you cough up blood.  You have severe belly (abdominal) pain.  You feel extremely weak.  You pass out (faint).  You have chills.  You have sudden, unexplained chest discomfort.  You have sudden, unexplained  discomfort in your arms, back, neck, or jaw.  You have shortness of breath at any time.  You suddenly start to sweat, or your skin gets clammy.  You feel nauseous.  You vomit.  You suddenly feel light-headed or dizzy.  Your heart begins to beat quickly, or it feels like it is skipping beats. These symptoms may be an emergency. Do not wait to see if the symptoms will go away. Get medical help right away. Call your local emergency services (911 in the [place] Do not drive yourself to the hospital.   This information is not intended to replace advice given to you by your health care provider. Make sure you discuss any questions you have with your health care provider.   Document Released: 02/07/2008 Document Revised: 09/11/2014 Document Reviewed: 03/27/2014 Elsevier Interactive Patient Education [employer]2016 Elsevier Inc.

## 2015-07-20 NOTE — ED Notes (Signed)
Add on (Hepatic function, Lipase) sent to lab. Lab called and made aware.

## 2015-07-20 NOTE — ED Notes (Signed)
MD at bedside. 

## 2015-07-20 NOTE — ED Notes (Signed)
Pt c/o sudden onset L sided chest pain since "this afternoon." Pt sts pain is intermittent. Pt c/o SOB when the pain comes. Pt also c/o abdominal bloating that she has been dealing with for awhile now. Pt denies N/V, c/o fatigue. Pt has hx of fibromyalgia. Pt sts that the chest pain worsens with movement. Pt is poor historian and is difficult to keep focused. A&Ox4 and ambulatory.

## 2017-07-07 DIAGNOSIS — H40033 Anatomical narrow angle, bilateral: Secondary | ICD-10-CM | POA: Diagnosis not present

## 2017-07-07 DIAGNOSIS — G44209 Tension-type headache, unspecified, not intractable: Secondary | ICD-10-CM | POA: Diagnosis not present

## 2017-08-01 ENCOUNTER — Ambulatory Visit (INDEPENDENT_AMBULATORY_CARE_PROVIDER_SITE_OTHER): Payer: Medicaid Other | Admitting: Family Medicine

## 2017-08-01 ENCOUNTER — Encounter: Payer: Self-pay | Admitting: Family Medicine

## 2017-08-01 VITALS — BP 125/78 | HR 78 | Temp 98.1°F | Resp 16 | Ht 61.0 in | Wt 134.0 lb

## 2017-08-01 DIAGNOSIS — R5383 Other fatigue: Secondary | ICD-10-CM | POA: Diagnosis not present

## 2017-08-01 DIAGNOSIS — M797 Fibromyalgia: Secondary | ICD-10-CM | POA: Diagnosis not present

## 2017-08-01 DIAGNOSIS — G629 Polyneuropathy, unspecified: Secondary | ICD-10-CM | POA: Insufficient documentation

## 2017-08-01 DIAGNOSIS — F17200 Nicotine dependence, unspecified, uncomplicated: Secondary | ICD-10-CM

## 2017-08-01 LAB — POCT URINALYSIS DIP (DEVICE)
BILIRUBIN URINE: NEGATIVE
Glucose, UA: NEGATIVE mg/dL
HGB URINE DIPSTICK: NEGATIVE
KETONES UR: NEGATIVE mg/dL
Leukocytes, UA: NEGATIVE
NITRITE: NEGATIVE
PH: 7 (ref 5.0–8.0)
PROTEIN: NEGATIVE mg/dL
Specific Gravity, Urine: 1.015 (ref 1.005–1.030)
Urobilinogen, UA: 0.2 mg/dL (ref 0.0–1.0)

## 2017-08-01 LAB — POCT GLYCOSYLATED HEMOGLOBIN (HGB A1C): HEMOGLOBIN A1C: 5.3

## 2017-08-01 MED ORDER — MELOXICAM 7.5 MG PO TABS: 7.5000 mg | ORAL_TABLET | Freq: Every day | ORAL | 1 refills | Status: DC

## 2017-08-01 MED ORDER — GABAPENTIN 100 MG PO CAPS: 100.0000 mg | ORAL_CAPSULE | Freq: Three times a day (TID) | ORAL | 1 refills | Status: DC

## 2017-08-01 NOTE — Progress Notes (Signed)
Subjective:    Patient ID: Haley Reyes, female    DOB: April 18, 1976, 41 y.o.   MRN: [id number]  HPI  Haley BertholdSonya Reyes, a 41 year old female presents to establish care.  She is previously a patient at [facility], but has been lost to follow-up for greater than a year. Patient patient reports a history of fibromyalgia.  Initial onset was 5 years ago.  Current symptoms are of moderate severityThey are made worse by: cold exposure, kneeling, movement, overhead work, raising arm over head, standing and walking.  Treatments include meloxicam and gabapentin.  She has been out of medications for greater than 1 year.   Evaluation to date includes neurology and pain management recent interventions  Patient is also complaining of fatigue.  Symptoms began several months ago.Symptoms of her fatigue have been feelings of depression, general malaise and lack of interest in usual activities.   Patient denies exercise intolerance, unusual rashes, cold intolerance, constipation and change in hair texture., GI blood loss and excessive menstrual bleeding. Past Medical History:  Diagnosis Date  . Angina at rest [facility](HCC)   . Anxiety   . Fibromyalgia   . Insomnia    Social History   Socioeconomic History  . Marital status: Single    Spouse name: Not on file  . Number of children: 4  . Years of education: Not on file  . Highest education level: Not on file  Social Needs  . Financial resource strain: Not on file  . Food insecurity - worry: Not on file  . Food insecurity - inability: Not on file  . Transportation needs - medical: Not on file  . Transportation needs - non-medical: Not on file  Occupational History  . Occupation: unemployed    Associate Professormployer: NOT EMPLOYED  Tobacco Use  . Smoking status: Current Every Day Smoker    Packs/day: 0.50    Types: Cigarettes    Start date: 09/04/1998  . Smokeless tobacco: Former Engineer, waterUser  Substance and Sexual Activity  . Alcohol use: Yes    Comment: glass of red wine  occassionally during the week. None at the time d/t new medications.   . Drug use: No  . Sexual activity: Not on file  Other Topics Concern  . Not on file  Social History Narrative  . Not on file   Review of Systems  Constitutional: Positive for fatigue. Negative for fever and unexpected weight change.  HENT: Negative.   Eyes: Negative for photophobia and visual disturbance.  Cardiovascular: Negative.  Negative for chest pain, palpitations and leg swelling.  Gastrointestinal: Positive for abdominal distention.  Endocrine: Negative for polydipsia, polyphagia and polyuria.  Genitourinary: Negative.   Musculoskeletal: Negative.   Skin: Negative.   Neurological: Negative.   Hematological: Negative.   Psychiatric/Behavioral: Negative for agitation and sleep disturbance. The patient is nervous/anxious.        Objective:   Physical Exam  Constitutional: She is oriented to person, place, and time. She appears well-developed and well-nourished.  HENT:  Head: Normocephalic and atraumatic.  Right Ear: External ear normal.  Left Ear: External ear normal.  Nose: Nose normal.  Mouth/Throat: Oropharynx is clear and moist.  Eyes: Conjunctivae are normal. Pupils are equal, round, and reactive to light.  Neck: Normal range of motion. Neck supple.  Cardiovascular: Normal rate, regular rhythm, normal heart sounds and intact distal pulses.  Pulmonary/Chest: Effort normal and breath sounds normal.  Abdominal: Soft. Bowel sounds are normal.  Musculoskeletal:       Lumbar  back: She exhibits decreased range of motion and pain.  Neurological: She is alert and oriented to person, place, and time. She has normal reflexes.  Skin: Skin is warm and dry.  Psychiatric: She has a normal mood and affect. Her behavior is normal. Judgment and thought content normal.      BP 125/78 (BP Location: Left Arm, Patient Position: Sitting, Cuff Size: Normal)   Pulse 78   Temp 98.1 F (36.7 C) (Oral)   Resp 16    Ht 5\' 1"  (1.549 m)   Wt 134 lb (60.8 kg)   LMP 07/17/2017   SpO2 100%   BMI 25.32 kg/m  Assessment & Plan:  1. Fatigue, unspecified type - Vitamin D, 25-hydroxy - TSH - CBC with Differential - HgB A1c  2. Fibromyalgia  - gabapentin (NEURONTIN) 100 MG capsule; Take 1 capsule (100 mg total) by mouth 3 (three) times daily.  Dispense: 90 capsule; Refill: 1 - meloxicam (MOBIC) 7.5 MG tablet; Take 1 tablet (7.5 mg total) by mouth daily.  Dispense: 30 tablet; Refill: 1  3. Neuropathy - gabapentin (NEURONTIN) 100 MG capsule; Take 1 capsule (100 mg total) by mouth 3 (three) times daily.  Dispense: 90 capsule; Refill: 1 - HIV antibody (with reflex)  3. Tobacco dependence Smoking cessation instruction/counseling given:  counseled patient on the dangers of tobacco use, advised patient to stop smoking, and reviewed strategies to maximize success    RTC: 1 month for fibromyalgia   Nolon NationsLaChina Moore Glennie Rodda  MSN, FNP-C Patient Care [facility] [address]  [place], [place] [id number] [phone]

## 2017-08-01 NOTE — Patient Instructions (Signed)
Will start a trial of gabapentin 100 mg three times daily. Refrain from drinking, driving, or operating machinery while taking this medication. Will follow up by phone with any abnormal laboratory results.  Myofascial Pain Syndrome and Fibromyalgia Myofascial pain syndrome and fibromyalgia are both pain disorders. This pain may be felt mainly in your muscles.  Myofascial pain syndrome: ? Always has trigger points or tender points in the muscle that will cause pain when pressed. The pain may come and go. ? Usually affects your neck, upper back, and shoulder areas. The pain often radiates into your arms and hands.  Fibromyalgia: ? Has muscle pains and tenderness that come and go. ? Is often associated with fatigue and sleep disturbances. ? Has trigger points. ? Tends to be long-lasting (chronic), but is not life-threatening.  Fibromyalgia and myofascial pain are not the same. However, they often occur together. If you have both conditions, each can make the other worse. Both are common and can cause enough pain and fatigue to make day-to-day activities difficult. What are the causes? The exact causes of fibromyalgia and myofascial pain are not known. People with certain gene types may be more likely to develop fibromyalgia. Some factors can be triggers for both conditions, such as:  Spine disorders.  Arthritis.  Severe injury (trauma) and other physical stressors.  Being under a lot of stress.  A medical illness.  What are the signs or symptoms? Fibromyalgia The main symptom of fibromyalgia is widespread pain and tenderness in your muscles. This can vary over time. Pain is sometimes described as stabbing, shooting, or burning. You may have tingling or numbness, too. You may also have sleep problems and fatigue. You may wake up feeling tired and groggy (fibro fog). Other symptoms may include:  Bowel and bladder problems.  Headaches.  Visual problems.  Problems with odors and  noises.  Depression or mood changes.  Painful menstrual periods (dysmenorrhea).  Dry skin or eyes.  Myofascial pain syndrome Symptoms of myofascial pain syndrome include:  Tight, ropy bands of muscle.  Uncomfortable sensations in muscular areas, such as: ? Aching. ? Cramping. ? Burning. ? Numbness. ? Tingling. ? Muscle weakness.  Trouble moving certain muscles freely (range of motion).  How is this diagnosed? There are no specific tests to diagnose fibromyalgia or myofascial pain syndrome. Both can be hard to diagnose because their symptoms are common in many other conditions. Your health care provider may suspect one or both of these conditions based on your symptoms and medical history. Your health care provider will also do a physical exam. The key to diagnosing fibromyalgia is having pain, fatigue, and other symptoms for more than three months that cannot be explained by another condition. The key to diagnosing myofascial pain syndrome is finding trigger points in muscles that are tender and cause pain elsewhere in your body (referred pain). How is this treated? Treating fibromyalgia and myofascial pain often requires a team of health care providers. This usually starts with your primary provider and a physical therapist. You may also find it helpful to work with alternative health care providers, such as massage therapists or acupuncturists. Treatment for fibromyalgia may include medicines. This may include nonsteroidal anti-inflammatory drugs (NSAIDs), along with other medicines. Treatment for myofascial pain may also include:  NSAIDs.  Cooling and stretching of muscles.  Trigger point injections.  Sound wave (ultrasound) treatments to stimulate muscles.  Follow these instructions at home:  Take medicines only as directed by your health care provider.  Exercise  as directed by your health care provider or physical therapist.  Try to avoid stressful  situations.  Practice relaxation techniques to control your stress. You may want to try: ? Biofeedback. ? Visual imagery. ? Hypnosis. ? Muscle relaxation. ? Yoga. ? Meditation.  Talk to your health care provider about alternative treatments, such as acupuncture or massage treatment.  Maintain a healthy lifestyle. This includes eating a healthy diet and getting enough sleep.  Consider joining a support group.  Do not do activities that stress or strain your muscles. That includes repetitive motions and heavy lifting. Where to find more information:  National Fibromyalgia Association: www.fmaware.org  Arthritis Foundation: www.arthritis.org  American Chronic Pain Association: GumSearch.nlwww.theacpa.org/condition/myofascial-pain Contact a health care provider if:  You have new symptoms.  Your symptoms get worse.  You have side effects from your medicines.  You have trouble sleeping.  Your condition is causing depression or anxiety. This information is not intended to replace advice given to you by your health care provider. Make sure you discuss any questions you have with your health care provider. Document Released: 08/21/2005 Document Revised: 01/27/2016 Document Reviewed: 05/27/2014 Elsevier Interactive Patient Education  [employer]2018 Elsevier Inc.

## 2017-08-01 NOTE — Addendum Note (Signed)
Addended by: Massie MaroonHOLLIS, Melik Blancett M on: 08/01/2017 02:46 PM   Modules accepted: Orders

## 2017-08-02 ENCOUNTER — Other Ambulatory Visit: Payer: Self-pay | Admitting: Family Medicine

## 2017-08-02 ENCOUNTER — Telehealth: Payer: Self-pay

## 2017-08-02 DIAGNOSIS — E559 Vitamin D deficiency, unspecified: Secondary | ICD-10-CM

## 2017-08-02 LAB — CBC WITH DIFFERENTIAL/PLATELET
BASOS ABS: 38 {cells}/uL (ref 0–200)
Basophils Relative: 0.7 %
EOS ABS: 49 {cells}/uL (ref 15–500)
Eosinophils Relative: 0.9 %
HCT: 39.9 % (ref 35.0–45.0)
Hemoglobin: 13.4 g/dL (ref 11.7–15.5)
Lymphs Abs: 2344 cells/uL (ref 850–3900)
MCH: 29.8 pg (ref 27.0–33.0)
MCHC: 33.6 g/dL (ref 32.0–36.0)
MCV: 88.7 fL (ref 80.0–100.0)
MONOS PCT: 7.9 %
MPV: 10.4 fL (ref 7.5–12.5)
Neutro Abs: 2543 cells/uL (ref 1500–7800)
Neutrophils Relative %: 47.1 %
PLATELETS: 258 10*3/uL (ref 140–400)
RBC: 4.5 10*6/uL (ref 3.80–5.10)
RDW: 12.1 % (ref 11.0–15.0)
TOTAL LYMPHOCYTE: 43.4 %
WBC: 5.4 10*3/uL (ref 3.8–10.8)
WBCMIX: 427 {cells}/uL (ref 200–950)

## 2017-08-02 LAB — HIV ANTIBODY (ROUTINE TESTING W REFLEX): HIV 1&2 Ab, 4th Generation: NONREACTIVE

## 2017-08-02 LAB — VITAMIN D 25 HYDROXY (VIT D DEFICIENCY, FRACTURES): Vit D, 25-Hydroxy: 13 ng/mL — ABNORMAL LOW (ref 30–100)

## 2017-08-02 LAB — TSH: TSH: 0.75 mIU/L

## 2017-08-02 MED ORDER — ERGOCALCIFEROL 1.25 MG (50000 UT) PO CAPS: 50000.0000 [IU] | ORAL_CAPSULE | ORAL | 1 refills | Status: DC

## 2017-08-02 NOTE — Telephone Encounter (Signed)
-----   Message from Massie MaroonLachina M Hollis, [place] sent at 08/02/2017 11:11 AM EST ----- Regarding: Lab results Please inform patient that vitamin D level is decreased which is consistent with a vitamin D deficiency.  Will start Drisdol 50,000 units weekly.  Will recheck vitamin D level in 3 months Thanks ----- Message ----- From: Loney HeringBatten, Raymond Bhardwaj E, LPN Sent: 29/56/213011/28/2018  10:42 AM To: Massie MaroonLachina M Hollis, FNP

## 2017-08-02 NOTE — Telephone Encounter (Signed)
Called, no answer. Voicemail was not set up yet. Will try later. Thanks!

## 2017-08-02 NOTE — Progress Notes (Signed)
Meds ordered this encounter  Medications  . ergocalciferol (DRISDOL) 50000 units capsule    Sig: Take 1 capsule (50,000 Units total) by mouth once a week.    Dispense:  30 capsule    Refill:  1  Nolon NationsLaChina Moore Viridiana Spaid  MSN, FNP-C Patient [facility] [address]  [place], [place] [id number] [phone]

## 2017-08-03 NOTE — Telephone Encounter (Signed)
Called no answer. No  Voicemail set up will try later.

## 2017-08-06 ENCOUNTER — Telehealth: Payer: Self-pay | Admitting: Family Medicine

## 2017-08-06 NOTE — Telephone Encounter (Signed)
Patient called and stated that she went to the pharmacy and saw that she had an rx for Vitamin D and she was hoping to get more info regarding that. Patient also states that she would like to get results of blood work that was done as well. Please advise.

## 2017-08-06 NOTE — Telephone Encounter (Signed)
Called and spoke with patient. Advised that vitamin D was low and that she needs to take vitamin D supplement once a week for 3 months and then we will recheck. Patient verbalized understanding. Thanks!

## 2017-08-08 ENCOUNTER — Telehealth: Payer: Self-pay

## 2017-08-08 NOTE — Telephone Encounter (Signed)
-----   Message from Massie MaroonLachina M Hollis, [place] sent at 08/08/2017  3:30 PM EST ----- Regarding: RE: disablility letter  I will not be able to write a letter.She will need to sign a medical records release with her disability representative so that they will have access to my note. Also, she has had one visit to establish care and I have not reviewed previous medical records.   Thanks  ----- Message ----- From: Loney HeringBatten, Laura E, LPN Sent: 40/9/811912/01/2017   3:15 PM To: Massie MaroonLachina M Hollis, FNP Subject: disablility letter                             Patient called and states that is time for her disability renewal and she needs a letter stating that she is disabled. Can you provide this for her? Please advise. Her call back number is [phone]. Thanks!

## 2017-08-08 NOTE — Telephone Encounter (Signed)
Called and spoke with patient. Advised that she will need to contact disability representative and have them fax over a medical record release to gain access to her records. Patient verbalized understanding. Thanks!

## 2017-08-09 ENCOUNTER — Telehealth: Payer: Self-pay

## 2017-08-09 NOTE — Telephone Encounter (Signed)
Patient called saying her lawyer will not accept her case unless she has a letter saying she is disabled. I advised her yesterday that she would need the lawyer to fax a medical record release to us and she states she told them that. However, they said they have to have a letter from us saying she is disabled before they can do anything. Can you call and explain this better to her? Please call the mobile number ([phone] number). Thanks!

## 2017-08-10 ENCOUNTER — Telehealth: Payer: Self-pay | Admitting: Family Medicine

## 2017-08-10 NOTE — Telephone Encounter (Signed)
Haley Reyes, a 41 year old female called to discuss a letter of disability. I advised patient have her representative request medical records. Patient has had one appointment to establish care. I will need to evaluate previous medical records. She will need to present to sign a medical records release form.    Nolon NationsLaChina Reyes Haley Strub  MSN, FNP-C Patient Care [facility] [address]  [place], [place] [address] [phone]

## 2017-08-21 ENCOUNTER — Telehealth: Payer: Self-pay

## 2017-08-21 NOTE — Telephone Encounter (Signed)
Patient was asking for a joint injection in her ankle for pain. I advised her she would need to be seen. She has an appointment for 08/31/2017 and did not want to move this sooner.

## 2017-08-31 ENCOUNTER — Ambulatory Visit: Payer: Medicaid Other | Admitting: Family Medicine

## 2017-09-05 ENCOUNTER — Ambulatory Visit: Payer: Medicaid Other | Admitting: Family Medicine

## 2017-09-06 ENCOUNTER — Ambulatory Visit: Payer: Medicaid Other | Admitting: Family Medicine

## 2017-09-14 ENCOUNTER — Ambulatory Visit (INDEPENDENT_AMBULATORY_CARE_PROVIDER_SITE_OTHER): Payer: Medicaid Other | Admitting: Family Medicine

## 2017-09-14 ENCOUNTER — Encounter: Payer: Self-pay | Admitting: Family Medicine

## 2017-09-14 VITALS — BP 123/85 | HR 96 | Temp 98.4°F | Resp 14 | Ht 61.0 in | Wt 135.0 lb

## 2017-09-14 DIAGNOSIS — F419 Anxiety disorder, unspecified: Secondary | ICD-10-CM

## 2017-09-14 DIAGNOSIS — B351 Tinea unguium: Secondary | ICD-10-CM | POA: Diagnosis not present

## 2017-09-14 DIAGNOSIS — G629 Polyneuropathy, unspecified: Secondary | ICD-10-CM | POA: Diagnosis not present

## 2017-09-14 DIAGNOSIS — F4310 Post-traumatic stress disorder, unspecified: Secondary | ICD-10-CM

## 2017-09-14 DIAGNOSIS — M797 Fibromyalgia: Secondary | ICD-10-CM

## 2017-09-14 DIAGNOSIS — M170 Bilateral primary osteoarthritis of knee: Secondary | ICD-10-CM

## 2017-09-14 DIAGNOSIS — M79676 Pain in unspecified toe(s): Secondary | ICD-10-CM

## 2017-09-14 DIAGNOSIS — R5383 Other fatigue: Secondary | ICD-10-CM

## 2017-09-14 DIAGNOSIS — R32 Unspecified urinary incontinence: Secondary | ICD-10-CM

## 2017-09-14 NOTE — Patient Instructions (Signed)
Patient has a history of posttraumatic stress disorder, will send a referral to psychiatry for further workup and evaluation.  No medications warranted on today. For fibromyalgia we will continue gabapentin 100 mg 3 times per day we will send refills to pharmacy.  Patient has a history of osteoarthritis in both knees, will continue meloxicam 7.5 mg daily. Reviewed previous laboratory values, patient has a vitamin D deficiency we will recheck vitamin D level in 3 months. You were complaining about incontinence of urine we will follow-up by phone with any negative laboratory results. You also complained of pain, discoloration, and toenail abnormality to right great toe.  We will send a referral to podiatry for further workup and evaluation. We will follow-up in office in 3 months. Please call if you have any questions or concerns

## 2017-09-14 NOTE — Progress Notes (Signed)
Subjective:    Patient ID: Haley Reyes, female    DOB: 27-Mar-1976, 42 y.o.   MRN: [id number]  HPI  Haley Reyes, a 42 year old female presents for follow up of chronic condition.  Patien has a history of fibromyalgia and osteoarthritis.  Initial onset was 5 years ago.  Current symptoms are of moderate severity. They are made worse by: cold exposure, kneeling, movement, overhead work, raising arm over head, standing and walking.  Treatments include meloxicam and gabapentin.  Evaluation to date includes neurology and pain management recent interventions  Patient continues to have chronic fatigue.  Symptoms began several months ago.Symptoms of her fatigue have been feelings of depression, general malaise and lack of interest in usual activities.   Patient denies exercise intolerance, unusual rashes, cold intolerance, constipation and change in hair texture., GI blood loss and excessive menstrual bleeding. Haley Reyes is also complaining of anxiety. She says that certain movies and conversations trigger childhood memories. She says that she has been under the care of psychiatry in the past. She says that she stopped going because she did not want her family to "think that she was crazy". Patient also also been on antidepressants in the past. She currently denies suicidal or homicidal intent.  Past Medical History:  Diagnosis Date  . Angina at rest [facility])   . Anxiety   . Fibromyalgia   . Insomnia    Social History   Socioeconomic History  . Marital status: Single    Spouse name: Not on file  . Number of children: 4  . Years of education: Not on file  . Highest education level: Not on file  Social Needs  . Financial resource strain: Not on file  . Food insecurity - worry: Not on file  . Food insecurity - inability: Not on file  . Transportation needs - medical: Not on file  . Transportation needs - non-medical: Not on file  Occupational History  . Occupation: unemployed    Associate Professor: NOT  EMPLOYED  Tobacco Use  . Smoking status: Current Every Day Smoker    Packs/day: 0.50    Types: Cigarettes    Start date: 09/04/1998  . Smokeless tobacco: Former Engineer, water and Sexual Activity  . Alcohol use: Yes    Comment: glass of red wine occassionally during the week. None at the time d/t new medications.   . Drug use: No  . Sexual activity: Not on file  Other Topics Concern  . Not on file  Social History Narrative  . Not on file   Review of Systems  Constitutional: Positive for fatigue. Negative for fever and unexpected weight change.  HENT: Negative.   Eyes: Negative for photophobia and visual disturbance.  Cardiovascular: Negative.  Negative for chest pain, palpitations and leg swelling.  Gastrointestinal: Positive for abdominal distention.  Endocrine: Negative for polydipsia, polyphagia and polyuria.  Genitourinary: Negative.   Musculoskeletal: Negative.   Skin: Negative.   Neurological: Positive for numbness. Negative for dizziness.  Hematological: Negative.   Psychiatric/Behavioral: Negative for agitation and sleep disturbance. The patient is nervous/anxious.        Objective:   Physical Exam  Constitutional: She is oriented to person, place, and time. She appears well-developed and well-nourished.  HENT:  Head: Normocephalic and atraumatic.  Right Ear: External ear normal.  Left Ear: External ear normal.  Nose: Nose normal.  Mouth/Throat: Oropharynx is clear and moist.  Eyes: Conjunctivae are normal. Pupils are equal, round, and reactive to light.  Neck:  Normal range of motion. Neck supple.  Cardiovascular: Normal rate, regular rhythm, normal heart sounds and intact distal pulses.  Pulmonary/Chest: Effort normal and breath sounds normal.  Abdominal: Soft. Bowel sounds are normal.  Musculoskeletal:       Lumbar back: She exhibits decreased range of motion and pain.  Neurological: She is alert and oriented to person, place, and time. She has normal  reflexes.  Skin: Skin is warm and dry.  Great toenails yellowing and ingrown   Psychiatric: She has a normal mood and affect. Her behavior is normal. Judgment and thought content normal.      BP 123/85 (BP Location: Left Arm, Patient Position: Sitting, Cuff Size: Normal)   Pulse 96   Temp 98.4 F (36.9 C) (Oral)   Resp 14   Ht 5\' 1"  (1.549 m)   Wt 135 lb (61.2 kg)   LMP 08/18/2017   SpO2 100%   BMI 25.51 kg/m  Assessment & Plan:  1. Fatigue, unspecified type Reviewed previous labs, within normal range  2. Fibromyalgia - gabapentin (NEURONTIN) 100 MG capsule; Take 1 capsule (100 mg total) by mouth 3 (three) times daily.  Dispense: 90 capsule; Refill: 1 - meloxicam (MOBIC) 7.5 MG tablet; Take 1 tablet (7.5 mg total) by mouth daily.  Dispense: 30 tablet; Refill: 1  3. Neuropathy - gabapentin (NEURONTIN) 100 MG capsule; Take 1 capsule (100 mg total) by mouth 3 (three) times daily.  Dispense: 90 capsule; Refill: 1  - meloxicam (MOBIC) 7.5 MG tablet; Take 1 tablet (7.5 mg total) by mouth daily.  Dispense: 30 tablet; Refill: 1  4. PTSD (Reyes-traumatic stress disorder) Patient warrants further evaluation by psychiatry, will send a referral.  - Ambulatory referral to Psychiatry  5. Anxiety - Ambulatory referral to Psychiatry  6. Osteoarthritis of both knees, unspecified osteoarthritis type Will continue Meloxicam 7.5 mg daily with food  7. Urinary incontinence, unspecified type Complains of periodic incontinence. Will review urinalysis.  - Urinalysis, Routine w reflex microscopic  8. Pain due to onychomycosis of toenail - Ambulatory referral to Podiatry   RTC: 3 months for chronic conditions   Nolon NationsLachina Moore Niels Cranshaw  MSN, FNP-C Patient Care [facility] [address]  [place], [place] [address] [phone]

## 2017-09-15 LAB — URINALYSIS, ROUTINE W REFLEX MICROSCOPIC
Bilirubin, UA: NEGATIVE
Glucose, UA: NEGATIVE
Ketones, UA: NEGATIVE
LEUKOCYTES UA: NEGATIVE
NITRITE UA: NEGATIVE
PH UA: 7.5 (ref 5.0–7.5)
Protein, UA: NEGATIVE
RBC, UA: NEGATIVE
Specific Gravity, UA: 1.016 (ref 1.005–1.030)
Urobilinogen, Ur: 0.2 mg/dL (ref 0.2–1.0)

## 2017-09-16 MED ORDER — GABAPENTIN 100 MG PO CAPS: 100.0000 mg | ORAL_CAPSULE | Freq: Three times a day (TID) | ORAL | 1 refills | Status: DC

## 2017-09-16 MED ORDER — MELOXICAM 7.5 MG PO TABS: 7.5000 mg | ORAL_TABLET | Freq: Every day | ORAL | 1 refills | Status: DC

## 2017-10-31 ENCOUNTER — Ambulatory Visit: Payer: Medicaid Other | Admitting: Podiatry

## 2017-11-01 ENCOUNTER — Telehealth: Payer: Self-pay

## 2017-11-01 NOTE — Telephone Encounter (Signed)
Called, no answer. Left a message for patient to call back. Thanks!  

## 2017-12-14 ENCOUNTER — Encounter: Payer: Self-pay | Admitting: Family Medicine

## 2017-12-14 ENCOUNTER — Ambulatory Visit (INDEPENDENT_AMBULATORY_CARE_PROVIDER_SITE_OTHER): Payer: Medicaid Other | Admitting: Family Medicine

## 2017-12-14 VITALS — BP 130/88 | HR 76 | Temp 98.5°F | Resp 14 | Ht 61.0 in | Wt 135.0 lb

## 2017-12-14 DIAGNOSIS — E559 Vitamin D deficiency, unspecified: Secondary | ICD-10-CM | POA: Diagnosis not present

## 2017-12-14 DIAGNOSIS — F4310 Post-traumatic stress disorder, unspecified: Secondary | ICD-10-CM | POA: Diagnosis not present

## 2017-12-14 DIAGNOSIS — G894 Chronic pain syndrome: Secondary | ICD-10-CM

## 2017-12-14 DIAGNOSIS — M797 Fibromyalgia: Secondary | ICD-10-CM

## 2017-12-14 MED ORDER — GABAPENTIN 300 MG PO CAPS: 300.0000 mg | ORAL_CAPSULE | Freq: Three times a day (TID) | ORAL | 1 refills | Status: DC

## 2017-12-14 NOTE — Progress Notes (Signed)
Subjective:    Patient ID: Haley Reyes, female    DOB: 1975/12/30, 42 y.o.   MRN: [id number]  HPI  Haley Reyes, a 42 year old female presents for follow up of chronic conditions.  Patien has a history of fibromyalgia and osteoarthritis.  Initial onset was 5 years ago.  Current symptoms are of moderate severity. They are made worse by: cold exposure, kneeling, movement, overhead work, raising arm over head, standing and walking.  Treatments include meloxicam and gabapentin.  Evaluation to date includes neurology and pain management recent interventions.   Patient continues to have chronic fatigue.  Symptoms began several months ago.Symptoms of her fatigue have been feelings of depression, general malaise and lack of interest in usual activities.   Patient denies exercise intolerance, unusual rashes, cold intolerance, constipation and change in hair texture., GI blood loss and excessive menstrual bleeding.  Haley Reyes is also complaining of anxiety. She says that certain movies and conversations trigger childhood memories. She says that she has been under the care of psychiatry in the past.  Patient also also been on antidepressants in the past. She currently denies suicidal or homicidal intent. A referral to establish care with psychiatry was sent, but patient has not scheduled appointment.  Past Medical History:  Diagnosis Date  . [facility] rest [facility]   . Anxiety   . Fibromyalgia   . Insomnia    Social History   Socioeconomic History  . Marital status: Single    Spouse name: Not on file  . Number of children: 4  . Years of education: Not on file  . Highest education level: Not on file  Occupational History  . 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 Financial resource strain: Not on file  . Food insecurity:    Worry: Not on file    Inability: Not on file  . Transportation needs:    Medical: Not on file    Non-medical: Not on file  Tobacco Use  .  Smoking status: Current Every Day Smoker    Packs/day: 0.50    Types: Cigarettes    Start date: 09/04/1998  . Smokeless tobacco: Former Engineer, waterUser  Substance and Sexual Activity  . Alcohol use: Yes    Comment: glass of red wine occassionally during the week. None at the time d/t new medications.   . Drug use: No  . Sexual activity: Not on file  Lifestyle  . Physical activity:    Days per week: Not on file    Minutes per session: Not on file  . Stress: Not on file  Relationships  . Social connections:    Talks on phone: Not on file    Gets together: Not on file    Attends religious service: Not on file    Active member of club or organization: Not on file    Attends meetings of clubs or organizations: Not on file    Relationship status: Not on file  . Intimate partner violence:    Fear of current or ex partner: Not on file    Emotionally abused: Not on file    Physically abused: Not on file    Forced sexual activity: Not on file  Other Topics Concern  . Not on file  Social History Narrative  . Not on file   Review of Systems  Constitutional: Positive for fatigue. Negative for fever and unexpected weight change.  HENT: Negative.   Eyes: Negative for photophobia and visual  disturbance.  Cardiovascular: Negative.  Negative for chest pain, palpitations and leg swelling.  Gastrointestinal: Positive for abdominal distention.  Endocrine: Negative for polydipsia, polyphagia and polyuria.  Genitourinary: Negative.   Musculoskeletal: Negative.   Skin: Negative.   Neurological: Positive for numbness. Negative for dizziness.  Hematological: Negative.   Psychiatric/Behavioral: Negative for agitation and sleep disturbance. The patient is nervous/anxious.        Objective:   Physical Exam  Constitutional: She is oriented to person, place, and time. She appears well-developed and well-nourished.  HENT:  Head: Normocephalic and atraumatic.  Right Ear: External ear normal.  Left Ear:  External ear normal.  Nose: Nose normal.  Mouth/Throat: Oropharynx is clear and moist.  Eyes: Pupils are equal, round, and reactive to light. Conjunctivae are normal.  Neck: Normal range of motion. Neck supple.  Cardiovascular: Normal rate, regular rhythm, normal heart sounds and intact distal pulses.  Pulmonary/Chest: Effort normal and breath sounds normal.  Abdominal: Soft. Bowel sounds are normal.  Musculoskeletal:       Lumbar back: She exhibits decreased range of motion and pain.  Neurological: She is alert and oriented to person, place, and time. She has normal reflexes.  Skin: Skin is warm and dry.  Psychiatric: She has a normal mood and affect. Her behavior is normal. Judgment and thought content normal.      BP 130/88 (BP Location: Left Arm, Patient Position: Sitting, Cuff Size: Normal)   Pulse 76   Temp 98.5 F (36.9 C) (Oral)   Resp 14   Ht 5\' 1"  (1.549 m)   Wt 135 lb (61.2 kg)   LMP 12/04/2017   SpO2 100%   BMI 25.51 kg/m  Assessment & Plan:  1. Fibromyalgia - gabapentin (NEURONTIN) 300 MG capsule; Take 1 capsule (300 mg total) by mouth 3 (three) times daily.  Dispense: 90 capsule; Refill: 1  2. Chronic pain syndrome - gabapentin (NEURONTIN) 300 MG capsule; Take 1 capsule (300 mg total) by mouth 3 (three) times daily.  Dispense: 90 capsule; Refill: 1  3. Vitamin D deficiency - Vitamin D, 25-hydroxy  4. PTSD (post-traumatic stress disorder) - Ambulatory referral to Psychiatry   RTC: 3 months for chronic conditions  Nolon Nations  MSN, FNP-C Patient Care [facility] [address] [place], [place] [address] [phone]

## 2017-12-14 NOTE — Patient Instructions (Signed)
I have recent referral to psychiatry for evaluation. We will follow-up by phone with any abnormal laboratory results.  For your chronic pain syndrome, I have increased gabapentin to 300 mg every 8 hours.  Refrain from drinking, driving, or operating machinery while taking this medication.    Chronic Pain, Adult Chronic pain is a type of pain that lasts or keeps coming back (recurs) for at least six months. You may have chronic headaches, abdominal pain, or body pain. Chronic pain may be related to an illness, such as fibromyalgia or complex regional pain syndrome. Sometimes the cause of chronic pain is not known. Chronic pain can make it hard for you to do daily activities. If not treated, chronic pain can lead to other health problems, including anxiety and depression. Treatment depends on the cause and severity of your pain. You may need to work with a pain specialist to come up with a treatment plan. The plan may include medicine, counseling, and physical therapy. Many people benefit from a combination of two or more types of treatment to control their pain. Follow these instructions at home: Lifestyle  Consider keeping a pain diary to share with your health care providers.  Consider talking with a mental health care provider (psychologist) about how to cope with chronic pain.  Consider joining a chronic pain support group.  Try to control or lower your stress levels. Talk to your health care provider about strategies to do this. General instructions   Take over-the-counter and prescription medicines only as told by your health care provider.  Follow your treatment plan as told by your health care provider. This may include: ? Gentle, regular exercise. ? Eating a healthy diet that includes foods such as vegetables, fruits, fish, and lean meats. ? Cognitive or behavioral therapy. ? Working with a Adult nursephysical therapist. ? Meditation or yoga. ? Acupuncture or massage therapy. ? Aroma,  color, light, or sound therapy. ? Local electrical stimulation. ? Shots (injections) of numbing or pain-relieving medicines into the spine or the area of pain.  Check your pain level as told by your health care provider. Ask your health care provider if you should use a pain scale.  Learn as much as you can about how to manage your chronic pain. Ask your health care provider if an intensive pain rehabilitation program or a chronic pain specialist would be helpful.  Keep all follow-up visits as told by your health care provider. This is important. Contact a health care provider if:  Your pain gets worse.  You have new pain.  You have trouble sleeping.  You have trouble doing your normal activities.  Your pain is not controlled with treatment.  Your have side effects from pain medicine.  You feel weak. Get help right away if:  You lose feeling or have numbness in your body.  You lose control of bowel or bladder function.  Your pain suddenly gets much worse.  You develop shaking or chills.  You develop confusion.  You develop chest pain.  You have trouble breathing or shortness of breath.  You pass out.  You have thoughts about hurting yourself or others. This information is not intended to replace advice given to you by your health care provider. Make sure you discuss any questions you have with your health care provider. Document Released: 05/13/2002 Document Revised: 04/20/2016 Document Reviewed: 02/08/2016 Elsevier Interactive Patient Education  [employer]2018 Elsevier Inc.

## 2017-12-15 LAB — VITAMIN D 25 HYDROXY (VIT D DEFICIENCY, FRACTURES): Vit D, 25-Hydroxy: 17.1 ng/mL — ABNORMAL LOW (ref 30.0–100.0)

## 2017-12-17 ENCOUNTER — Telehealth: Payer: Self-pay

## 2017-12-17 ENCOUNTER — Other Ambulatory Visit: Payer: Self-pay

## 2017-12-17 NOTE — Telephone Encounter (Signed)
-----   Message from Massie MaroonLachina M Hollis, [place] sent at 12/15/2017  7:22 AM EDT ----- Regarding: lab results Please inform patient that vitamin D level remains decreased, will continue weekly Drisdol as prescribed. Discuss the importance of taking medications consistently in order to achieve positive outcomes.   Nolon NationsLachina Moore Hollis  MSN, FNP-C Patient Care [facility] [address]  [place], [place] [address] [phone]

## 2017-12-17 NOTE — Telephone Encounter (Signed)
Called and advised that vitamin d level is still low and she needs to take vitamin D weekly as prescribed. Thanks!

## 2017-12-19 ENCOUNTER — Ambulatory Visit: Payer: Medicaid Other | Admitting: Podiatry

## 2017-12-31 ENCOUNTER — Ambulatory Visit (INDEPENDENT_AMBULATORY_CARE_PROVIDER_SITE_OTHER): Payer: Medicaid Other | Admitting: Family Medicine

## 2017-12-31 VITALS — BP 108/72 | HR 72 | Temp 97.8°F | Resp 14 | Ht 61.0 in | Wt 133.0 lb

## 2017-12-31 DIAGNOSIS — R0982 Postnasal drip: Secondary | ICD-10-CM | POA: Diagnosis not present

## 2017-12-31 DIAGNOSIS — J301 Allergic rhinitis due to pollen: Secondary | ICD-10-CM | POA: Diagnosis not present

## 2017-12-31 MED ORDER — FLUTICASONE PROPIONATE 50 MCG/ACT NA SUSP: 2.0000 | Freq: Every day | NASAL | 6 refills | Status: DC

## 2017-12-31 MED ORDER — LORATADINE 10 MG PO TABS: 10.0000 mg | ORAL_TABLET | Freq: Every day | ORAL | 11 refills | Status: DC

## 2017-12-31 MED ORDER — MONTELUKAST SODIUM 10 MG PO TABS: 10.0000 mg | ORAL_TABLET | Freq: Every day | ORAL | 3 refills | Status: DC

## 2017-12-31 MED ORDER — OLOPATADINE HCL 0.2 % OP SOLN: 1.0000 [drp] | Freq: Every day | OPHTHALMIC | 1 refills | Status: DC | PRN

## 2017-12-31 NOTE — Patient Instructions (Signed)
For allergic rhinitis will start the following treatment regimen: We will start a trial of loratadine 10 mg at bedtime, fluticasone 1 spray to each nostril daily, Pataday eyedrops once daily to each eye as needed and Singulair daily   If symptoms have not improved within the next month, please call and schedule a follow-up appointment.  You may warrant referral for allergy testing.  Avoid going outside during periods of high pollen index      .

## 2017-12-31 NOTE — Progress Notes (Signed)
Subjective:    Haley Reyes is a 42 y.o. female presents complaining of seasonal allergies.  Patient's symptoms include clear rhinorrhea, headaches, itchy eyes, itchy nose, itchy palate, nasal congestion, postnasal drip, pressure sensation in ears and swelling of eyes. These symptoms are seasonal. Current triggers include exposure to pollens, dust, mold and weather changes. The patient has been suffering from these symptoms over the past several weeks. Haley Reyes states that her daughter was prescribed cetiriine and she has tried medication with some relief. She has not attempted any other OTC interventions to alleviate symptoms. Patient is requesting allergy testing on today.  Past Medical History:  Diagnosis Date  . Angina at rest [facility])   . Anxiety   . Fibromyalgia   . Insomnia    Social History   Socioeconomic History  . Marital status: Single    Spouse name: Not on file  . Number of children: 4  . Years of education: Not on file  . Highest education level: Not on file  Occupational History  . 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 Financial resource strain: Not on file  . Food insecurity:    Worry: Not on file    Inability: Not on file  . Transportation needs:    Medical: Not on file    Non-medical: Not on file  Tobacco Use  . Smoking status: Current Every Day Smoker    Packs/day: 0.50    Types: Cigarettes    Start date: 09/04/1998  . Smokeless tobacco: Former Engineer, water and Sexual Activity  . Alcohol use: Yes    Comment: glass of red wine occassionally during the week. None at the time d/t new medications.   . Drug use: No  . Sexual activity: Not on file  Lifestyle  . Physical activity:    Days per week: Not on file    Minutes per session: Not on file  . Stress: Not on file  Relationships  . Social connections:    Talks on phone: Not on file    Gets together: Not on file    Attends religious service: Not on file    Active member of club  or organization: Not on file    Attends meetings of clubs or organizations: Not on file    Relationship status: Not on file  . Intimate partner violence:    Fear of current or ex partner: Not on file    Emotionally abused: Not on file    Physically abused: Not on file    Forced sexual activity: Not on file  Other Topics Concern  . Not on file  Social History Narrative  . Not on file  Review of Systems  Constitutional: Negative.   HENT: Positive for congestion, sinus pain and sore throat.   Eyes: Positive for redness.  Respiratory: Negative.   Cardiovascular: Negative.   Gastrointestinal: Negative.   Genitourinary: Negative.   Musculoskeletal: Negative.   Skin: Negative.   Neurological: Negative.   Endo/Heme/Allergies: Positive for environmental allergies.  Psychiatric/Behavioral: Negative.      Objective:  Physical Exam  Constitutional: She is oriented to person, place, and time.  HENT:  Head: Normocephalic.  Right Ear: External ear normal. Tympanic membrane is erythematous.  Left Ear: External ear normal. Tympanic membrane is erythematous.  Nose: Mucosal edema present.  Mouth/Throat: Oropharyngeal exudate (Clear exudate) and posterior oropharyngeal erythema present.  Cardiovascular: Normal rate, regular rhythm, normal heart sounds and intact distal pulses.  Pulmonary/Chest: Effort  normal and breath sounds normal.  Abdominal: Soft. Bowel sounds are normal.  Neurological: She is alert and oriented to person, place, and time.  Skin: Skin is warm and dry.    Assessment:     Allergic rhinitis    Plan:   Seasonal allergic rhinitis due to pollen - loratadine (CLARITIN) 10 MG tablet; Take 1 tablet (10 mg total) by mouth daily.  Dispense: 30 tablet; Refill: 11 - fluticasone (FLONASE) 50 MCG/ACT nasal spray; Place 2 sprays into both nostrils daily.  Dispense: 16 g; Refill: 6 - Olopatadine HCl 0.2 % SOLN; Apply 1 drop to eye daily as needed.  Dispense: 1 Bottle; Refill: 1 -  montelukast (SINGULAIR) 10 MG tablet; Take 1 tablet (10 mg total) by mouth at bedtime.  Dispense: 30 tablet; Refill: 3  Post-nasal drip - montelukast (SINGULAIR) 10 MG tablet; Take 1 tablet (10 mg total) by mouth at bedtime.  Dispense: 30 tablet; Refill: 3  Medication: medical regimen as noted. Discussed medication dosage, usage, side effects, and goals of treatment in detail. Aggressive environmental controls.    The patient was given clear instructions to go to ER or return to medical center if symptoms do not improve, worsen or new problems develop. The patient verbalized understanding.   Nolon Nations  MSN, FNP-C Patient Care [facility] [address] [place], [place] [address] [phone]

## 2018-01-04 ENCOUNTER — Encounter: Payer: Self-pay | Admitting: Family Medicine

## 2018-03-11 ENCOUNTER — Ambulatory Visit (HOSPITAL_COMMUNITY): Payer: Medicaid Other | Admitting: Psychiatry

## 2018-03-15 ENCOUNTER — Ambulatory Visit: Payer: Medicaid Other | Admitting: Family Medicine

## 2018-04-05 ENCOUNTER — Telehealth: Payer: Self-pay

## 2018-04-05 ENCOUNTER — Ambulatory Visit (INDEPENDENT_AMBULATORY_CARE_PROVIDER_SITE_OTHER): Payer: Medicaid Other | Admitting: Family Medicine

## 2018-04-05 ENCOUNTER — Encounter: Payer: Self-pay | Admitting: Family Medicine

## 2018-04-05 VITALS — BP 90/45 | HR 70 | Temp 98.0°F | Ht 60.0 in | Wt 136.0 lb

## 2018-04-05 DIAGNOSIS — M545 Low back pain: Secondary | ICD-10-CM | POA: Diagnosis not present

## 2018-04-05 DIAGNOSIS — Z0000 Encounter for general adult medical examination without abnormal findings: Secondary | ICD-10-CM | POA: Diagnosis not present

## 2018-04-05 DIAGNOSIS — G8929 Other chronic pain: Secondary | ICD-10-CM | POA: Diagnosis not present

## 2018-04-05 DIAGNOSIS — E559 Vitamin D deficiency, unspecified: Secondary | ICD-10-CM

## 2018-04-05 DIAGNOSIS — Z09 Encounter for follow-up examination after completed treatment for conditions other than malignant neoplasm: Secondary | ICD-10-CM | POA: Diagnosis not present

## 2018-04-05 DIAGNOSIS — Z131 Encounter for screening for diabetes mellitus: Secondary | ICD-10-CM | POA: Diagnosis not present

## 2018-04-05 DIAGNOSIS — M797 Fibromyalgia: Secondary | ICD-10-CM

## 2018-04-05 LAB — POCT URINALYSIS DIP (MANUAL ENTRY)
Bilirubin, UA: NEGATIVE
Blood, UA: NEGATIVE
Glucose, UA: NEGATIVE mg/dL
Ketones, POC UA: NEGATIVE mg/dL
Leukocytes, UA: NEGATIVE
Nitrite, UA: NEGATIVE
Protein Ur, POC: NEGATIVE mg/dL
Spec Grav, UA: 1.01 (ref 1.010–1.025)
Urobilinogen, UA: 0.2 E.U./dL
pH, UA: 7.5 (ref 5.0–8.0)

## 2018-04-05 LAB — POCT GLYCOSYLATED HEMOGLOBIN (HGB A1C): Hemoglobin A1C: 5.1 % (ref 4.0–5.6)

## 2018-04-05 MED ORDER — NAPROXEN 500 MG PO TABS: 500.0000 mg | ORAL_TABLET | Freq: Two times a day (BID) | ORAL | 1 refills | Status: DC

## 2018-04-05 NOTE — Progress Notes (Signed)
Follow Up  Subjective:    Patient ID: Haley Reyes, female    DOB: 02-16-1976, 42 y.o.   MRN: [id number]   Chief Complaint  Patient presents with  . Follow-up    chronic condition    HPI  Ms. Earlene PlaterDavis has a past medical history of Back Pain, Insomnia, Fibromyalgia, and Anxiety. She is here today for follow up.  Current Status: Since her last office visit, she has experienced numbness in extremity numbness in areas if she sits or lies for prolonged periods of time. She occasionally wears back brace. Otherwise, she is doing well.  Since her last office visit, she is doing well with no complaints. She denies fevers, chills, fatigue, recent infections, weight loss, and night sweats. She has not had any headaches, visual changes, dizziness, and falls. No chest pain, heart palpitations, cough and shortness of breath reported. No reports of GI problems such as nausea, vomiting, diarrhea, and constipation. She has no reports of blood in stools, dysuria and hematuria. No depression or anxiety. She denies pain today.   Review of Systems  Constitutional: Negative.   HENT: Negative.   Eyes: Negative.   Respiratory: Negative.   Cardiovascular: Negative.   Gastrointestinal: Negative.   Endocrine: Negative.   Genitourinary: Negative.   Musculoskeletal: Positive for back pain (chronic).  Skin: Negative.   Allergic/Immunologic: Negative.   Neurological: Positive for numbness (occasional in extremities).  Hematological: Negative.   Psychiatric/Behavioral: Negative.    Objective:   Physical Exam  Constitutional: She is oriented to person, place, and time. She appears well-developed and well-nourished.  HENT:  Head: Normocephalic and atraumatic.  Right Ear: External ear normal.  Left Ear: External ear normal.  Nose: Nose normal.  Mouth/Throat: Oropharynx is clear and moist.  Eyes: Pupils are equal, round, and reactive to light. Conjunctivae and EOM are normal.  Neck: Normal range of motion.  Neck supple.  Cardiovascular: Normal rate, regular rhythm, normal heart sounds and intact distal pulses.  Pulmonary/Chest: Effort normal and breath sounds normal.  Abdominal: Soft. Bowel sounds are normal.  Musculoskeletal: Normal range of motion.  Limited ROM in back   Neurological: She is alert and oriented to person, place, and time.  Skin: Skin is warm and dry. Capillary refill takes less than 2 seconds.  Psychiatric: She has a normal mood and affect. Her behavior is normal. Judgment and thought content normal.  Nursing note and vitals reviewed.  Assessment & Plan:   1. Screening for diabetes mellitus Hgb A1c is normal at 5.1 today. She will continue to decrease foods/beverages high in sugars and carbs and follow Heart Healthy or DASH diet. Increase physical activity to at least 30 minutes cardio exercise daily.   - POCT glycosylated hemoglobin (Hb A1C) - POCT urinalysis dipstick  2. Fibromyalgia - naproxen (NAPROSYN) 500 MG tablet; Take 1 tablet (500 mg total) by mouth 2 (two) times daily with a meal.  Dispense: 60 tablet; Refill: 1  3. Chronic bilateral low back pain without sciatica She was previously taking Mobic, today we will try Naproxen. She will discontinue Mobic today.  - naproxen (NAPROSYN) 500 MG tablet; Take 1 tablet (500 mg total) by mouth 2 (two) times daily with a meal.  Dispense: 60 tablet; Refill: 1  4. Vitamin D deficiency Vitamin D level decreased at 17.1 on 12/14/2017. She will continue Vitamin D supplement as prescribed.  - Vitamin D, 25-hydroxy  5. Healthcare maintenance - CBC with Differential - Comprehensive metabolic panel - Lipid Panel - TSH  6. Follow up She will follow up in 2 months.   Meds ordered this encounter  Medications  . naproxen (NAPROSYN) 500 MG tablet    Sig: Take 1 tablet (500 mg total) by mouth 2 (two) times daily with a meal.    Dispense:  60 tablet    Refill:  1

## 2018-04-05 NOTE — Patient Instructions (Signed)
Naproxen and naproxen sodium oral immediate-release tablets What is this medicine? NAPROXEN (na PROX en) is a non-steroidal anti-inflammatory drug (NSAID). It is used to reduce swelling and to treat pain. This medicine may be used for dental pain, headache, or painful monthly periods. It is also used for painful joint and muscular problems such as arthritis, tendinitis, bursitis, and gout. This medicine may be used for other purposes; ask your health care provider or pharmacist if you have questions. COMMON BRAND NAME(S): Aflaxen, Aleve, Aleve Arthritis, All Day Relief, Anaprox, Anaprox DS, Naprosyn, [facility] Naproxen Sodium What should I tell my health care provider before I take this medicine? They need to know if you have any of these conditions: -asthma -cigarette smoker -drink more than 3 alcohol containing drinks a day -heart disease or circulation problems such as heart failure or leg edema (fluid retention) -high blood pressure -kidney disease -liver disease -stomach bleeding or ulcers -an unusual or allergic reaction to naproxen, aspirin, other NSAIDs, other medicines, foods, dyes, or preservatives -pregnant or trying to get pregnant -breast-feeding How should I use this medicine? Take this medicine by mouth with a glass of water. Follow the directions on the prescription label. Take it with food if your stomach gets upset. Try to not lie down for at least 10 minutes after you take it. Take your medicine at regular intervals. Do not take your medicine more often than directed. Long-term, continuous use may increase the risk of heart attack or stroke. A special MedGuide will be given to you by the pharmacist with each prescription and refill. Be sure to read this information carefully each time. Talk to your pediatrician regarding the use of this medicine in children. Special care may be needed. Overdosage: If you think you have taken too much of this medicine contact a poison control  center or emergency room at once. NOTE: This medicine is only for you. Do not share this medicine with others. What if I miss a dose? If you miss a dose, take it as soon as you can. If it is almost time for your next dose, take only that dose. Do not take double or extra doses. What may interact with this medicine? -alcohol -aspirin -cidofovir -diuretics -lithium -methotrexate -other drugs for inflammation like ketorolac or prednisone -pemetrexed -probenecid -warfarin This list may not describe all possible interactions. Give your health care provider a list of all the medicines, herbs, non-prescription drugs, or dietary supplements you use. Also tell them if you smoke, drink alcohol, or use illegal drugs. Some items may interact with your medicine. What should I watch for while using this medicine? Tell your doctor or health care professional if your pain does not get better. Talk to your doctor before taking another medicine for pain. Do not treat yourself. This medicine does not prevent heart attack or stroke. In fact, this medicine may increase the chance of a heart attack or stroke. The chance may increase with longer use of this medicine and in people who have heart disease. If you take aspirin to prevent heart attack or stroke, talk with your doctor or health care professional. Do not take other medicines that contain aspirin, ibuprofen, or naproxen with this medicine. Side effects such as stomach upset, nausea, or ulcers may be more likely to occur. Many medicines available without a prescription should not be taken with this medicine. This medicine can cause ulcers and bleeding in the stomach and intestines at any time during treatment. Do not smoke cigarettes or drink   alcohol. These increase irritation to your stomach and can make it more susceptible to damage from this medicine. Ulcers and bleeding can happen without warning symptoms and can cause death. You may get drowsy or dizzy.  Do not drive, use machinery, or do anything that needs mental alertness until you know how this medicine affects you. Do not stand or sit up quickly, especially if you are an older patient. This reduces the risk of dizzy or fainting spells. This medicine can cause you to bleed more easily. Try to avoid damage to your teeth and gums when you brush or floss your teeth. What side effects may I notice from receiving this medicine? Side effects that you should report to your doctor or health care professional as soon as possible: -black or bloody stools, blood in the urine or vomit -blurred vision -chest pain -difficulty breathing or wheezing -nausea or vomiting -severe stomach pain -skin rash, skin redness, blistering or peeling skin, hives, or itching -slurred speech or weakness on one side of the body -swelling of eyelids, throat, lips -unexplained weight gain or swelling -unusually weak or tired -yellowing of eyes or skin Side effects that usually do not require medical attention (report to your doctor or health care professional if they continue or are bothersome): -constipation -headache -heartburn This list may not describe all possible side effects. Call your doctor for medical advice about side effects. You may report side effects to FDA at 1-800-FDA-1088. Where should I keep my medicine? Keep out of the reach of children. Store at room temperature between 15 and 30 degrees C (59 and 86 degrees F). Keep container tightly closed. Throw away any unused medicine after the expiration date. NOTE: This sheet is a summary. It may not cover all possible information. If you have questions about this medicine, talk to your doctor, pharmacist, or health care provider.  2018 Elsevier/Gold Standard (2009-08-23 20:10:16)  

## 2018-04-06 LAB — COMPREHENSIVE METABOLIC PANEL
ALT: 16 IU/L (ref 0–32)
AST: 18 IU/L (ref 0–40)
Albumin/Globulin Ratio: 1.5 (ref 1.2–2.2)
Albumin: 4.4 g/dL (ref 3.5–5.5)
Alkaline Phosphatase: 37 IU/L — ABNORMAL LOW (ref 39–117)
BUN/Creatinine Ratio: 10 (ref 9–23)
BUN: 8 mg/dL (ref 6–24)
Bilirubin Total: 0.4 mg/dL (ref 0.0–1.2)
CO2: 21 mmol/L (ref 20–29)
Calcium: 9.5 mg/dL (ref 8.7–10.2)
Chloride: 103 mmol/L (ref 96–106)
Creatinine, Ser: 0.81 mg/dL (ref 0.57–1.00)
GFR calc Af Amer: 105 mL/min/{1.73_m2} (ref >59)
GFR calc non Af Amer: 91 mL/min/{1.73_m2} (ref >59)
Globulin, Total: 2.9 g/dL (ref 1.5–4.5)
Glucose: 87 mg/dL (ref 65–99)
Potassium: 4.1 mmol/L (ref 3.5–5.2)
Sodium: 139 mmol/L (ref 134–144)
Total Protein: 7.3 g/dL (ref 6.0–8.5)

## 2018-04-06 LAB — TSH: TSH: 0.618 u[IU]/mL (ref 0.450–4.500)

## 2018-04-06 LAB — CBC WITH DIFFERENTIAL/PLATELET
Basophils Absolute: 0 10*3/uL (ref 0.0–0.2)
Basos: 0 %
EOS (ABSOLUTE): 0.1 10*3/uL (ref 0.0–0.4)
Eos: 2 %
Hematocrit: 40.7 % (ref 34.0–46.6)
Hemoglobin: 13.1 g/dL (ref 11.1–15.9)
Immature Grans (Abs): 0 10*3/uL (ref 0.0–0.1)
Immature Granulocytes: 0 %
Lymphocytes Absolute: 2.6 10*3/uL (ref 0.7–3.1)
Lymphs: 43 %
MCH: 29.2 pg (ref 26.6–33.0)
MCHC: 32.2 g/dL (ref 31.5–35.7)
MCV: 91 fL (ref 79–97)
Monocytes Absolute: 0.6 10*3/uL (ref 0.1–0.9)
Monocytes: 9 %
Neutrophils Absolute: 2.8 10*3/uL (ref 1.4–7.0)
Neutrophils: 46 %
Platelets: 286 10*3/uL (ref 150–450)
RBC: 4.49 x10E6/uL (ref 3.77–5.28)
RDW: 13.5 % (ref 12.3–15.4)
WBC: 6.2 10*3/uL (ref 3.4–10.8)

## 2018-04-06 LAB — LIPID PANEL
Chol/HDL Ratio: 2.7 ratio (ref 0.0–4.4)
Cholesterol, Total: 150 mg/dL (ref 100–199)
HDL: 55 mg/dL (ref >39)
LDL Calculated: 79 mg/dL (ref 0–99)
Triglycerides: 82 mg/dL (ref 0–149)
VLDL Cholesterol Cal: 16 mg/dL (ref 5–40)

## 2018-04-06 LAB — VITAMIN D 25 HYDROXY (VIT D DEFICIENCY, FRACTURES): Vit D, 25-Hydroxy: 17.6 ng/mL — ABNORMAL LOW (ref 30.0–100.0)

## 2018-04-06 MED ORDER — ERGOCALCIFEROL 1.25 MG (50000 UT) PO CAPS: 50000.0000 [IU] | ORAL_CAPSULE | ORAL | 2 refills | Status: DC

## 2018-04-08 NOTE — Telephone Encounter (Signed)
-----   Message from Kallie LocksNatalie M Stroud, FNP sent at 04/06/2018  8:25 PM EDT ----- Regarding: "NSAID" Lyla SonCarrie,   Could you also remind patient that we sent new Rx for Naproxen for back and general pain issues. We will discontinue Mobic.    Thank you.

## 2018-04-08 NOTE — Telephone Encounter (Signed)
Patient notified

## 2018-04-08 NOTE — Telephone Encounter (Signed)
-----   Message from Kallie LocksNatalie M Stroud, FNP sent at 04/06/2018  8:17 PM EDT ----- Regarding: "Refill" Haley Reyes,  Please call patient and let her know that last Vitamin D level was decreased, so we send Rx for Vitamin D to pharmacy today.   Thanks.

## 2018-06-05 ENCOUNTER — Ambulatory Visit: Payer: Medicaid Other | Admitting: Family Medicine

## 2018-06-12 ENCOUNTER — Ambulatory Visit: Payer: Medicaid Other | Admitting: Family Medicine

## 2018-07-15 ENCOUNTER — Ambulatory Visit (HOSPITAL_COMMUNITY): Payer: Medicaid Other | Admitting: Psychiatry

## 2018-08-30 ENCOUNTER — Ambulatory Visit (HOSPITAL_COMMUNITY): Payer: Self-pay | Admitting: Psychiatry

## 2018-09-12 ENCOUNTER — Encounter (HOSPITAL_COMMUNITY): Payer: Self-pay | Admitting: Psychiatry

## 2018-09-12 ENCOUNTER — Ambulatory Visit (INDEPENDENT_AMBULATORY_CARE_PROVIDER_SITE_OTHER): Payer: Medicaid Other | Admitting: Psychiatry

## 2018-09-12 VITALS — BP 122/74 | Ht 61.5 in | Wt 140.0 lb

## 2018-09-12 DIAGNOSIS — F4310 Post-traumatic stress disorder, unspecified: Secondary | ICD-10-CM

## 2018-09-12 DIAGNOSIS — F331 Major depressive disorder, recurrent, moderate: Secondary | ICD-10-CM | POA: Diagnosis not present

## 2018-09-12 MED ORDER — DULOXETINE HCL 20 MG PO CPEP: ORAL_CAPSULE | ORAL | 1 refills | Status: DC
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
## 2018-10-18 DIAGNOSIS — H40033 Anatomical narrow angle, bilateral: Secondary | ICD-10-CM | POA: Diagnosis not present

## 2018-10-18 DIAGNOSIS — H16223 Keratoconjunctivitis sicca, not specified as Sjogren's, bilateral: Secondary | ICD-10-CM | POA: Diagnosis not present

## 2018-10-20 DIAGNOSIS — H5213 Myopia, bilateral: Secondary | ICD-10-CM | POA: Diagnosis not present

## 2018-10-31 ENCOUNTER — Encounter (HOSPITAL_COMMUNITY): Payer: Self-pay | Admitting: Psychiatry

## 2018-10-31 ENCOUNTER — Encounter

## 2018-10-31 ENCOUNTER — Ambulatory Visit (INDEPENDENT_AMBULATORY_CARE_PROVIDER_SITE_OTHER): Payer: Medicaid Other | Admitting: Psychiatry

## 2018-10-31 VITALS — BP 122/74 | Ht 62.0 in | Wt 141.0 lb

## 2018-10-31 DIAGNOSIS — F331 Major depressive disorder, recurrent, moderate: Secondary | ICD-10-CM

## 2018-10-31 DIAGNOSIS — F4310 Post-traumatic stress disorder, unspecified: Secondary | ICD-10-CM
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
## 2018-11-26 ENCOUNTER — Ambulatory Visit (HOSPITAL_COMMUNITY): Payer: Medicaid Other | Admitting: Psychiatry

## 2019-04-17 DIAGNOSIS — H40033 Anatomical narrow angle, bilateral: Secondary | ICD-10-CM | POA: Diagnosis not present

## 2019-04-17 DIAGNOSIS — G44209 Tension-type headache, unspecified, not intractable: Secondary | ICD-10-CM | POA: Diagnosis not present

## 2019-06-17 DIAGNOSIS — H5213 Myopia, bilateral: Secondary | ICD-10-CM | POA: Diagnosis not present

## 2019-06-17 DIAGNOSIS — H1013 Acute atopic conjunctivitis, bilateral: Secondary | ICD-10-CM | POA: Diagnosis not present

## 2019-10-30 ENCOUNTER — Other Ambulatory Visit: Payer: Self-pay

## 2019-10-30 ENCOUNTER — Ambulatory Visit (INDEPENDENT_AMBULATORY_CARE_PROVIDER_SITE_OTHER): Payer: Medicaid Other | Admitting: Psychiatry

## 2019-10-30 ENCOUNTER — Encounter (HOSPITAL_COMMUNITY): Payer: Self-pay | Admitting: Psychiatry

## 2019-10-30 DIAGNOSIS — F4310 Post-traumatic stress disorder, unspecified: Secondary | ICD-10-CM

## 2019-10-30 DIAGNOSIS — F331 Major depressive disorder, recurrent, moderate: Secondary | ICD-10-CM | POA: Diagnosis not present

## 2019-10-30 MED ORDER — DULOXETINE HCL 20 MG PO CPEP: ORAL_CAPSULE | ORAL | 0 refills | Status: DC

## 2019-10-30 MED ORDER — GABAPENTIN 300 MG PO CAPS: 300.0000 mg | ORAL_CAPSULE | Freq: Every day | ORAL | 0 refills | Status: DC
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
## 2019-11-15 ENCOUNTER — Encounter (HOSPITAL_COMMUNITY): Payer: Self-pay

## 2019-11-15 ENCOUNTER — Other Ambulatory Visit: Payer: Self-pay

## 2019-11-15 ENCOUNTER — Emergency Department (HOSPITAL_COMMUNITY)
Admission: EM | Admit: 2019-11-15 | Discharge: 2019-11-16 | Disposition: A | Discharge status: Home / Self Care | Payer: Medicaid Other | Attending: Emergency Medicine | Admitting: Emergency Medicine

## 2019-11-15 DIAGNOSIS — F17210 Nicotine dependence, cigarettes, uncomplicated: Secondary | ICD-10-CM | POA: Insufficient documentation

## 2019-11-15 DIAGNOSIS — R519 Headache, unspecified: Secondary | ICD-10-CM | POA: Diagnosis not present

## 2019-11-15 DIAGNOSIS — M797 Fibromyalgia: Secondary | ICD-10-CM | POA: Insufficient documentation

## 2019-11-15 NOTE — ED Triage Notes (Signed)
Pt states that she has been has a discomfort on the left side of her temple for the last 2-3 days, and just generally "feeling odd". Pt described is as a pulling or throbbing feeling. Pt was trying to fall asleep but unable to due to the discomfort. Pt denies that it feels like a h/a. Denies changes to vision, or weakness. Pt states that she does have numbness but has a hx of fibromyalgia, and neuropathy.

## 2019-11-16 MED ORDER — NAPROXEN 375 MG PO TABS
375.0000 mg | ORAL_TABLET | Freq: Once | ORAL | Status: DC
  Filled 2019-11-16: qty 1

## 2019-11-16 MED ORDER — ACETAMINOPHEN 325 MG PO TABS
650.0000 mg | ORAL_TABLET | Freq: Once | ORAL | Status: AC
  Administered 2019-11-16: 02:00:00 650 mg via ORAL
  Filled 2019-11-16: qty 2

## 2019-11-16 NOTE — ED Provider Notes (Signed)
[facility]-EMERGENCY DEPT Provider Note   CSN: [id number] Arrival date & time: 11/15/19  2319     History No chief complaint on file.   Haley Reyes is a 44 y.o. female.  Patient to ED for evaluation of a "pulling" affecting the left temple. She denies pain. She has not taken anything for symptoms. No aggravating or alleviating factors. No ear pain or hearing change, no dental issues. No fever, congestion or cough. No photophobia or nausea.   The history is provided by the patient. No language interpreter was used.       Past Medical History:  Diagnosis Date  . Angina at rest [facility])   . Anxiety   . Arthritis   . Fibromyalgia   . Insomnia   . PTSD (post-traumatic stress disorder)     Patient Active Problem List   Diagnosis Date Noted  . Neuropathy 08/01/2017  . Fatigue 08/01/2017  . Fibromyalgia 07/23/2013  . Osteoarthritis of both knees 07/23/2013  . Rotator cuff tendonitis 07/23/2013  . 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 Inadequate sleep hygiene 01/06/2013    Past Surgical History:  Procedure Laterality Date  . CHOLECYSTECTOMY  2000  . cystic fibroids    . TUBAL LIGATION       OB History   No obstetric history on file.     Family History  Problem Relation Age of Onset  . Lung cancer Father   . Hypertension Father   . COPD Maternal Grandmother   . Congestive Heart Failure Maternal Grandmother   . Allergies Other        children x 4  . Asthma Other        children x 4  . Diabetes Other        fathers siblings  . Allergies Brother   . Congestive Heart Failure Other        aunt  . Sleep apnea Brother   . Hypertension Mother     Social History   Tobacco Use  . Smoking status: Current Every Day Smoker    Packs/day: 0.50    Types: Cigarettes    Start date: 09/04/1998  . Smokeless tobacco: Former Engineer, water Use Topics  . Alcohol use: Yes    Comment: glass of red wine occassionally during the week. None at the  time d/t new medications.   . Drug use: No    Home Medications Prior to Admission medications   Medication Sig Start Date End Date Taking? Authorizing Provider  DULoxetine (CYMBALTA) 20 MG capsule Take one capsule daily for 1 week and than twice a day 10/30/19   Arfeen, Phillips Grout, MD  ergocalciferol (DRISDOL) 50000 units capsule Take 1 capsule (50,000 Units total) by mouth once a week. Patient not taking: Reported on 10/30/2019 04/06/18   [name], FNP  gabapentin (NEURONTIN) 300 MG capsule Take 1 capsule (300 mg total) by mouth at bedtime. 10/30/19   Arfeen, Phillips Grout, MD  meloxicam (MOBIC) 7.5 MG tablet Take 1 tablet (7.5 mg total) by mouth daily. Patient not taking: Reported on 10/30/2019 09/16/17   Massie Maroon, FNP  naproxen (NAPROSYN) 500 MG tablet Take 1 tablet (500 mg total) by mouth 2 (two) times daily with a meal. Patient not taking: Reported on 10/30/2019 04/05/18   [name], FNP    Allergies    Benadryl [diphenhydramine hcl], Codeine, Latex, Hydrocodone-ibuprofen, and Other  Review of Systems   Review of Systems  Constitutional: Negative for fever.  HENT: Negative.  Negative for dental problem, ear pain, facial swelling, hearing loss and sore throat.   Eyes: Negative for photophobia and visual disturbance.  Gastrointestinal: Negative for nausea.  Musculoskeletal: Negative for neck pain.  Neurological: Negative for headaches.       See HPI.    Physical Exam Updated Vital Signs BP 113/77   Pulse 85   Temp 98.4 F (36.9 C) (Oral)   Resp 12   Ht 5\' 2"  (1.575 m)   Wt 61.7 kg   SpO2 100%   BMI 24.87 kg/m   Physical Exam Vitals and nursing note reviewed.  Constitutional:      General: She is not in acute distress.    Appearance: Normal appearance. She is normal weight. She is not ill-appearing.  HENT:     Head: Normocephalic and atraumatic.     Comments: No temporal bruit.    Left Ear: Tympanic membrane normal.     Nose: Nose normal.  Eyes:      Extraocular Movements: Extraocular movements intact.     Conjunctiva/sclera: Conjunctivae normal.     Pupils: Pupils are equal, round, and reactive to light.     Comments: Full painfree ROM eyes.   Cardiovascular:     Rate and Rhythm: Normal rate.  Pulmonary:     Effort: Pulmonary effort is normal.  Musculoskeletal:     Cervical back: Normal range of motion and neck supple.  Skin:    General: Skin is warm and dry.  Neurological:     Mental Status: She is alert and oriented to person, place, and time.     ED Results / Procedures / Treatments   Labs (all labs ordered are listed, but only abnormal results are displayed) Labs Reviewed - No data to display  EKG None  Radiology No results found.  Procedures Procedures (including critical care time)  Medications Ordered in ED Medications  naproxen (NAPROSYN) tablet 375 mg (has no administration in time range)    ED Course  I have reviewed the triage vital signs and the nursing notes.  Pertinent labs & imaging results that were available during my care of the patient were reviewed by me and considered in my medical decision making (see chart for details).    MDM Rules/Calculators/A&P                      Patient to ED with symptoms as described in the HPI.   Patient with tightness of left temple without other symptoms.   She has not tried anything to alleviate symptoms. Naproxen provided. VSS. Symptoms do not raise concern for infection, temporal arteritis, glaucoma, migraine. Will reassess after Naproxen.   Patient refused naproxen because it makes her drowsy and she is driving. Tylenol ordered.   Patient is reassured that she has a normal exam. No neurologic deficits. She can be discharged home and encouraged to see her doctor for recheck.   Final Clinical Impression(s) / ED Diagnoses Final diagnoses:  None   1. Headache  Rx / DC Orders ED Discharge Orders    None       Dennie Bible 11/16/19  0138    Palumbo, April, MD 11/16/19 [phone]

## 2019-11-16 NOTE — ED Notes (Signed)
Pt sitting up on the side of the bed talking. NAD noted. Pt ready for d/c.

## 2019-11-16 NOTE — Discharge Instructions (Addendum)
Take Tylenol for symptoms, 650 mg every 4 hours. Follow up with your doctor in 3-4 days for recheck. Return to the emergency department with any new or concerning symptoms.

## 2019-11-17 ENCOUNTER — Telehealth: Payer: Self-pay | Admitting: Family Medicine

## 2019-11-17 NOTE — Telephone Encounter (Signed)
Pt already has a PCP

## 2019-11-17 NOTE — Telephone Encounter (Signed)
-----   Message from [name], FNP sent at 11/16/2019  8:21 PM EDT ----- Regarding: "Hospital Follow Up" Please schedule patient for Hospital Follow Up asap. Schedule patient as a "New Patient" to re-establish care with our office. Thank you.

## 2019-11-27 ENCOUNTER — Other Ambulatory Visit: Payer: Self-pay

## 2019-11-27 ENCOUNTER — Ambulatory Visit (INDEPENDENT_AMBULATORY_CARE_PROVIDER_SITE_OTHER): Payer: Medicaid Other | Admitting: Psychiatry

## 2019-11-27 ENCOUNTER — Encounter: Payer: Self-pay | Admitting: Physician Assistant

## 2019-11-27 ENCOUNTER — Encounter (HOSPITAL_COMMUNITY): Payer: Self-pay | Admitting: Psychiatry

## 2019-11-27 DIAGNOSIS — F419 Anxiety disorder, unspecified: Secondary | ICD-10-CM | POA: Diagnosis not present

## 2019-11-27 DIAGNOSIS — F4310 Post-traumatic stress disorder, unspecified: Secondary | ICD-10-CM

## 2019-11-27 DIAGNOSIS — F331 Major depressive disorder, recurrent, moderate: Secondary | ICD-10-CM | POA: Diagnosis not present

## 2019-11-27 MED ORDER — ESCITALOPRAM OXALATE 10 MG PO TABS: ORAL_TABLET | ORAL | 1 refills | Status: DC

## 2019-11-27 MED ORDER — GABAPENTIN 400 MG PO CAPS: 400.0000 mg | ORAL_CAPSULE | Freq: Every day | ORAL | 1 refills | Status: DC
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
## 2019-12-02 ENCOUNTER — Other Ambulatory Visit: Payer: Self-pay | Admitting: Family Medicine

## 2019-12-02 DIAGNOSIS — E559 Vitamin D deficiency, unspecified: Secondary | ICD-10-CM

## 2019-12-04 DIAGNOSIS — R519 Headache, unspecified: Secondary | ICD-10-CM | POA: Diagnosis not present

## 2019-12-04 DIAGNOSIS — M9903 Segmental and somatic dysfunction of lumbar region: Secondary | ICD-10-CM | POA: Diagnosis not present

## 2019-12-04 DIAGNOSIS — M9902 Segmental and somatic dysfunction of thoracic region: Secondary | ICD-10-CM | POA: Diagnosis not present

## 2019-12-04 DIAGNOSIS — M9901 Segmental and somatic dysfunction of cervical region: Secondary | ICD-10-CM | POA: Diagnosis not present

## 2019-12-04 DIAGNOSIS — M5414 Radiculopathy, thoracic region: Secondary | ICD-10-CM | POA: Diagnosis not present

## 2019-12-04 DIAGNOSIS — M5386 Other specified dorsopathies, lumbar region: Secondary | ICD-10-CM | POA: Diagnosis not present

## 2019-12-10 ENCOUNTER — Ambulatory Visit: Payer: Medicaid Other | Admitting: Physician Assistant

## 2019-12-26 ENCOUNTER — Ambulatory Visit: Payer: Medicaid Other | Admitting: Physician Assistant

## 2019-12-26 DIAGNOSIS — M9903 Segmental and somatic dysfunction of lumbar region: Secondary | ICD-10-CM | POA: Diagnosis not present

## 2019-12-26 DIAGNOSIS — S39012A Strain of muscle, fascia and tendon of lower back, initial encounter: Secondary | ICD-10-CM | POA: Diagnosis not present

## 2019-12-26 DIAGNOSIS — M9901 Segmental and somatic dysfunction of cervical region: Secondary | ICD-10-CM | POA: Diagnosis not present

## 2019-12-26 DIAGNOSIS — M5386 Other specified dorsopathies, lumbar region: Secondary | ICD-10-CM | POA: Diagnosis not present

## 2019-12-26 DIAGNOSIS — S134XXA Sprain of ligaments of cervical spine, initial encounter: Secondary | ICD-10-CM | POA: Diagnosis not present

## 2019-12-26 DIAGNOSIS — M5414 Radiculopathy, thoracic region: Secondary | ICD-10-CM | POA: Diagnosis not present

## 2019-12-26 DIAGNOSIS — S29012A Strain of muscle and tendon of back wall of thorax, initial encounter: Secondary | ICD-10-CM | POA: Diagnosis not present

## 2019-12-26 DIAGNOSIS — R519 Headache, unspecified: Secondary | ICD-10-CM | POA: Diagnosis not present

## 2019-12-26 DIAGNOSIS — M9902 Segmental and somatic dysfunction of thoracic region: Secondary | ICD-10-CM | POA: Diagnosis not present

## 2020-01-09 ENCOUNTER — Ambulatory Visit (HOSPITAL_COMMUNITY): Payer: Medicaid Other | Admitting: Psychiatry

## 2020-01-12 ENCOUNTER — Other Ambulatory Visit: Payer: Self-pay

## 2020-01-12 ENCOUNTER — Telehealth (INDEPENDENT_AMBULATORY_CARE_PROVIDER_SITE_OTHER): Payer: Medicaid Other | Admitting: Psychiatry

## 2020-01-12 DIAGNOSIS — F331 Major depressive disorder, recurrent, moderate: Secondary | ICD-10-CM

## 2020-01-12 DIAGNOSIS — F4310 Post-traumatic stress disorder, unspecified: Secondary | ICD-10-CM | POA: Diagnosis not present

## 2020-01-12 MED ORDER — GABAPENTIN 400 MG PO CAPS: 400.0000 mg | ORAL_CAPSULE | Freq: Every day | ORAL | 2 refills | Status: DC

## 2020-01-12 MED ORDER — ESCITALOPRAM OXALATE 10 MG PO TABS: ORAL_TABLET | ORAL | 2 refills | Status: DC
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
## 2020-02-24 ENCOUNTER — Other Ambulatory Visit: Payer: Self-pay

## 2020-02-24 ENCOUNTER — Ambulatory Visit (INDEPENDENT_AMBULATORY_CARE_PROVIDER_SITE_OTHER): Payer: Medicaid Other | Admitting: Family Medicine

## 2020-02-24 ENCOUNTER — Encounter: Payer: Self-pay | Admitting: Family Medicine

## 2020-02-24 VITALS — BP 126/80 | HR 98 | Temp 97.9°F | Wt 123.0 lb

## 2020-02-24 DIAGNOSIS — M62838 Other muscle spasm: Secondary | ICD-10-CM | POA: Diagnosis not present

## 2020-02-24 DIAGNOSIS — Z09 Encounter for follow-up examination after completed treatment for conditions other than malignant neoplasm: Secondary | ICD-10-CM | POA: Diagnosis not present

## 2020-02-24 DIAGNOSIS — M797 Fibromyalgia: Secondary | ICD-10-CM

## 2020-02-24 DIAGNOSIS — Z3202 Encounter for pregnancy test, result negative: Secondary | ICD-10-CM | POA: Diagnosis not present

## 2020-02-24 DIAGNOSIS — Z0000 Encounter for general adult medical examination without abnormal findings: Secondary | ICD-10-CM

## 2020-02-24 DIAGNOSIS — G629 Polyneuropathy, unspecified: Secondary | ICD-10-CM | POA: Diagnosis not present

## 2020-02-24 DIAGNOSIS — G8929 Other chronic pain: Secondary | ICD-10-CM | POA: Diagnosis not present

## 2020-02-24 DIAGNOSIS — K219 Gastro-esophageal reflux disease without esophagitis: Secondary | ICD-10-CM | POA: Diagnosis not present

## 2020-02-24 DIAGNOSIS — M545 Low back pain, unspecified: Secondary | ICD-10-CM | POA: Insufficient documentation

## 2020-02-24 DIAGNOSIS — R112 Nausea with vomiting, unspecified: Secondary | ICD-10-CM

## 2020-02-24 LAB — POCT URINALYSIS DIPSTICK
Bilirubin, UA: NEGATIVE
Blood, UA: NEGATIVE
Glucose, UA: NEGATIVE
Ketones, UA: NEGATIVE
Leukocytes, UA: NEGATIVE
Nitrite, UA: NEGATIVE
Protein, UA: NEGATIVE
Spec Grav, UA: <=1.005 — AB (ref 1.010–1.025)
Urobilinogen, UA: 0.2 E.U./dL
pH, UA: 6.5 (ref 5.0–8.0)

## 2020-02-24 LAB — POCT URINE PREGNANCY: Preg Test, Ur: NEGATIVE

## 2020-02-24 MED ORDER — ONDANSETRON HCL 4 MG PO TABS: 4.0000 mg | ORAL_TABLET | Freq: Three times a day (TID) | ORAL | 3 refills | Status: DC | PRN

## 2020-02-24 MED ORDER — CYCLOBENZAPRINE HCL 10 MG PO TABS: 10.0000 mg | ORAL_TABLET | Freq: Three times a day (TID) | ORAL | 0 refills | Status: DC | PRN

## 2020-02-24 NOTE — Progress Notes (Signed)
[facility]   Re-Establish Care  Subjective:  Patient ID: Haley Reyes, female    DOB: December 18, 1975  Age: 44 y.o. MRN: [id number]  CC:  Chief Complaint  Patient presents with  . Fatigue    nausea and vomiting; requesting pregnancy test    HPI Haley Reyes is a 44 year old female who presents for Re-establish care today.    Patient Active Problem List   Diagnosis Date Noted  . Neuropathy 08/01/2017  . Fatigue 08/01/2017  . Fibromyalgia 07/23/2013  . Osteoarthritis of both knees 07/23/2013  . Rotator cuff tendonitis 07/23/2013  . 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 Inadequate sleep hygiene 01/06/2013      Past Medical History:  Diagnosis Date  . Angina at rest [facility])   . Anxiety   . Arthritis   . Fibromyalgia   . Insomnia   . Neuropathy   . PTSD (post-traumatic stress disorder)     Current Status: Since her last office visit, she is doing well with no complaints. Her anxiety is stable today. She continues to follow up with Psychiatrist as needed. She denies fevers, chills, fatigue, recent infections, weight loss, and night sweats. She has not had any headaches, visual changes, dizziness, and falls. No chest pain, heart palpitations, cough and shortness of breath reported. Denies GI problems such as nausea, vomiting, diarrhea, and constipation. She has no reports of blood in stools, dysuria and hematuria. No depression or anxiety, and denies suicidal ideations, homicidal ideations, or auditory hallucinations. She is taking all medications as prescribed. She denies pain today.   Past Surgical History:  Procedure Laterality Date  . CHOLECYSTECTOMY  2000  . cystic fibroids    . TUBAL LIGATION      Family History  Problem Relation Age of Onset  . Lung cancer Father   . Hypertension Father   . COPD Maternal Grandmother   . Congestive Heart Failure Maternal Grandmother   . Allergies Other        children x 4  .  Asthma Other        children x 4  . Diabetes Other        fathers siblings  . Allergies Brother   . Congestive Heart Failure Other        aunt  . Sleep apnea Brother   . Hypertension Mother     Social History   Socioeconomic History  . Marital status: Single    Spouse name: Not on file  . Number of children: 4  . Years of education: Not on file  . Highest education level: Not on file  Occupational History  . Occupation: unemployed    Associate Professor: NOT EMPLOYED  Tobacco Use  . Smoking status: Current Every Day Smoker    Packs/day: 0.50    Types: Cigarettes    Start date: 09/04/1998  . Smokeless tobacco: Former Clinical biochemist  . Vaping Use: Never used  Substance and Sexual Activity  . Alcohol use: Yes    Comment: glass of red wine occassionally during the week. None at the time d/t new medications.   . Drug use: No  . Sexual activity: Not Currently  Other Topics Concern  . Not on file  Social History Narrative  . Not on file   Social Determinants of Health   Financial Resource Strain:   . Difficulty of Paying Living Expenses:   Food Insecurity:   . Worried About Programme researcher, broadcasting/film/video  in the Last Year:   . Ran Out of Food in the Last Year:   Transportation Needs:   . Freight forwarder (Medical):   Marland Kitchen Lack of Transportation (Non-Medical):   Physical Activity:   . Days of Exercise per Week:   . Minutes of Exercise per Session:   Stress:   . Feeling of Stress :   Social Connections:   . Frequency of Communication with Friends and Family:   . Frequency of Social Gatherings with Friends and Family:   . Attends Religious Services:   . Active Member of Clubs or Organizations:   . Attends Banker Meetings:   Marland Kitchen Marital Status:   Intimate Partner Violence:   . Fear of Current or Ex-Partner:   . Emotionally Abused:   Marland Kitchen Physically Abused:   . Sexually Abused:     Outpatient Medications Prior to Visit  Medication Sig Dispense Refill  . escitalopram  (LEXAPRO) 10 MG tablet Take one tab daily 30 tablet 2  . gabapentin (NEURONTIN) 400 MG capsule Take 1 capsule (400 mg total) by mouth at bedtime. 30 capsule 2  . ergocalciferol (DRISDOL) 50000 units capsule Take 1 capsule (50,000 Units total) by mouth once a week. (Patient not taking: Reported on 10/30/2019) 5 capsule 2  . diclofenac Sodium (VOLTAREN) 1 % GEL Apply 2 g topically 4 (four) times daily as needed (pain). (Patient not taking: Reported on 02/24/2020)    . meloxicam (MOBIC) 7.5 MG tablet Take 1 tablet (7.5 mg total) by mouth daily. (Patient not taking: Reported on 10/30/2019) 30 tablet 1  . naproxen (NAPROSYN) 500 MG tablet Take 1 tablet (500 mg total) by mouth 2 (two) times daily with a meal. (Patient not taking: Reported on 02/24/2020) 60 tablet 1   No facility-administered medications prior to visit.    Allergies  Allergen Reactions  . Benadryl [Diphenhydramine Hcl]     unknown  . Codeine Other (See Comments)    Sweating real bad felt like she was going to pass out   . Hydrocodone-Ibuprofen Palpitations  . Latex Itching and Rash  . Other Nausea And Vomiting and Anxiety    *Caffine*    ROS Review of Systems  Constitutional: Negative.   HENT: Negative.   Eyes: Negative.   Respiratory: Negative.   Cardiovascular: Negative.   Gastrointestinal: Positive for nausea (occasionsal ) and vomiting (occasional ).  Endocrine: Negative.   Genitourinary: Negative.   Musculoskeletal: Positive for arthralgias (generalized joint pain) and myalgias (generalized muslce pain).  Skin: Negative.   Allergic/Immunologic: Negative.   Neurological: Positive for dizziness (occasional ) and headaches (occasional ).  Hematological: Negative.   Psychiatric/Behavioral: Negative.       Objective:    Physical Exam Vitals and nursing note reviewed.  Constitutional:      Appearance: Normal appearance.  HENT:     Head: Normocephalic and atraumatic.     Nose: Nose normal.  Cardiovascular:      Rate and Rhythm: Normal rate and regular rhythm.     Pulses: Normal pulses.     Heart sounds: Normal heart sounds.  Pulmonary:     Effort: Pulmonary effort is normal.     Breath sounds: Normal breath sounds.  Abdominal:     General: Bowel sounds are normal.     Palpations: Abdomen is soft.  Musculoskeletal:        General: Normal range of motion.     Cervical back: Normal range of motion and neck supple.  Skin:  General: Skin is warm and dry.  Neurological:     General: No focal deficit present.     Mental Status: She is alert and oriented to person, place, and time.  Psychiatric:        Mood and Affect: Mood normal.        Behavior: Behavior normal.        Thought Content: Thought content normal.        Judgment: Judgment normal.    BP 126/80 (BP Location: Right Arm, Patient Position: Sitting, Cuff Size: Small)   Pulse 98   Temp 97.9 F (36.6 C)   Wt 123 lb 0.4 oz (55.8 kg)   LMP 02/01/2020   SpO2 100%   BMI 22.50 kg/m  Wt Readings from Last 3 Encounters:  02/24/20 123 lb 0.4 oz (55.8 kg)  11/15/19 136 lb (61.7 kg)  04/05/18 136 lb (61.7 kg)     Health Maintenance Due  Topic Date Due  . Hepatitis C Screening  Never done  . COVID-19 Vaccine (1) Never done  . TETANUS/TDAP  Never done  . PAP SMEAR-Modifier  Never done    There are no preventive care reminders to display for this patient.  Lab Results  Component Value Date   TSH 0.618 04/05/2018   Lab Results  Component Value Date   WBC 6.2 04/05/2018   HGB 13.1 04/05/2018   HCT 40.7 04/05/2018   MCV 91 04/05/2018   PLT 286 04/05/2018   Lab Results  Component Value Date   NA 139 04/05/2018   K 4.1 04/05/2018   CO2 21 04/05/2018   GLUCOSE 87 04/05/2018   BUN 8 04/05/2018   CREATININE 0.81 04/05/2018   BILITOT 0.4 04/05/2018   ALKPHOS 37 (L) 04/05/2018   AST 18 04/05/2018   ALT 16 04/05/2018   PROT 7.3 04/05/2018   ALBUMIN 4.4 04/05/2018   CALCIUM 9.5 04/05/2018   ANIONGAP 7 07/20/2015   Lab  Results  Component Value Date   CHOL 150 04/05/2018   Lab Results  Component Value Date   HDL 55 04/05/2018   Lab Results  Component Value Date   LDLCALC 79 04/05/2018   Lab Results  Component Value Date   TRIG 82 04/05/2018   Lab Results  Component Value Date   CHOLHDL 2.7 04/05/2018   Lab Results  Component Value Date   HGBA1C 5.1 04/05/2018      Assessment & Plan:   1. Nausea and vomiting, intractability of vomiting not specified, unspecified vomiting type Stable today.  - POCT urine pregnancy - ondansetron (ZOFRAN) 4 MG tablet; Take 1 tablet (4 mg total) by mouth every 8 (eight) hours as needed for nausea or vomiting.  Dispense: 20 tablet; Refill: 3  2. Chronic bilateral low back pain without sciatica  3. Fibromyalgia  4. Neuropathy  5. Muscle spasm We will initiate Flexeril today.  - cyclobenzaprine (FLEXERIL) 10 MG tablet; Take 1 tablet (10 mg total) by mouth 3 (three) times daily as needed for muscle spasms.  Dispense: 30 tablet; Refill: 0  6. Gastroesophageal reflux disease without esophagitis Results are pending. - H. pylori breath test  7. Healthcare maintenance - Urinalysis Dipstick - CBC with Differential - Comprehensive metabolic panel - Lipid Panel - TSH - Vitamin B12 - Vitamin D, 25-hydroxy  8. Follow up She will follow up in 3 months.   Meds ordered this encounter  Medications  . cyclobenzaprine (FLEXERIL) 10 MG tablet    Sig: Take 1 tablet (10 mg  total) by mouth 3 (three) times daily as needed for muscle spasms.    Dispense:  30 tablet    Refill:  0  . ondansetron (ZOFRAN) 4 MG tablet    Sig: Take 1 tablet (4 mg total) by mouth every 8 (eight) hours as needed for nausea or vomiting.    Dispense:  20 tablet    Refill:  3    Orders Placed This Encounter  Procedures  . H. pylori breath test  . CBC with Differential  . Comprehensive metabolic panel  . Lipid Panel  . TSH  . Vitamin B12  . Vitamin D, 25-hydroxy  . POCT urine  pregnancy  . Urinalysis Dipstick    Referral Orders  No referral(s) requested today    Kathe Becton,  MSN, FNP-BC [place], [place] [address] [phone]- fax  Problem List Items Addressed This Visit      Nervous and Auditory   Neuropathy     Other   Fibromyalgia   Relevant Medications   cyclobenzaprine (FLEXERIL) 10 MG tablet    Other Visit Diagnoses    Nausea and vomiting, intractability of vomiting not specified, unspecified vomiting type    -  Primary   Relevant Medications   ondansetron (ZOFRAN) 4 MG tablet   Other Relevant Orders   POCT urine pregnancy (Completed)   Chronic bilateral low back pain without sciatica       Relevant Medications   cyclobenzaprine (FLEXERIL) 10 MG tablet   Muscle spasm       Relevant Medications   cyclobenzaprine (FLEXERIL) 10 MG tablet   Gastroesophageal reflux disease without esophagitis       Relevant Medications   ondansetron (ZOFRAN) 4 MG tablet   Other Relevant Orders   H. pylori breath test   Healthcare maintenance       Relevant Orders   Urinalysis Dipstick (Completed)   CBC with Differential   Comprehensive metabolic panel   Lipid Panel   TSH   Vitamin B12   Vitamin D, 25-hydroxy   Follow up          Meds ordered this encounter  Medications  . cyclobenzaprine (FLEXERIL) 10 MG tablet    Sig: Take 1 tablet (10 mg total) by mouth 3 (three) times daily as needed for muscle spasms.    Dispense:  30 tablet    Refill:  0  . ondansetron (ZOFRAN) 4 MG tablet    Sig: Take 1 tablet (4 mg total) by mouth every 8 (eight) hours as needed for nausea or vomiting.    Dispense:  20 tablet    Refill:  3    Follow-up: Return in about 3 months (around 05/26/2020).    Azzie Glatter, FNP

## 2020-02-24 NOTE — Patient Instructions (Addendum)
Muscle Cramps and Spasms Muscle cramps and spasms are when muscles tighten by themselves. They usually get better within minutes. Muscle cramps are painful. They are usually stronger and last longer than muscle spasms. Muscle spasms may or may not be painful. They can last a few seconds or much longer. Cramps and spasms can affect any muscle, but they occur most often in the calf muscles of the leg. They are usually not caused by a serious problem. In many cases, the cause is not known. Some common causes include:  Doing more physical work or exercise than your body is ready for.  Using the muscles too much (overuse) by repeating certain movements too many times.  Staying in a certain position for a long time.  Playing a sport or doing an activity without preparing properly.  Using bad form or technique while playing a sport or doing an activity.  Not having enough water in your body (dehydration).  Injury.  Side effects of some medicines.  Low levels of the salts and minerals in your blood (electrolytes), such as low potassium or calcium. Follow these instructions at home: Managing pain and stiffness      Massage, stretch, and relax the muscle. Do this for many minutes at a time.  If told, put heat on tight or tense muscles as often as told by your doctor. Use the heat source that your doctor recommends, such as a moist heat pack or a heating pad. ? Place a towel between your skin and the heat source. ? Leave the heat on for 20-30 minutes. ? Remove the heat if your skin turns bright red. This is very important if you are not able to feel pain, heat, or cold. You may have a greater risk of getting burned.  If told, put ice on the affected area. This may help if you are sore or have pain after a cramp or spasm. ? Put ice in a plastic bag. ? Place a towel between your skin and the bag. ? Leave the ice on for 20 minutes, 2-3 times a day.  Try taking hot showers or baths to help  relax tight muscles. Eating and drinking  Drink enough fluid to keep your pee (urine) pale yellow.  Eat a healthy diet to help ensure that your muscles work well. This should include: ? Fruits and vegetables. ? Lean protein. ? Whole grains. ? Low-fat or nonfat dairy products. General instructions  If you are having cramps often, avoid intense exercise for several days.  Take over-the-counter and prescription medicines only as told by your doctor.  Watch for any changes in your symptoms.  Keep all follow-up visits as told by your doctor. This is important. Contact a doctor if:  Your cramps or spasms get worse or happen more often.  Your cramps or spasms do not get better with time. Summary  Muscle cramps and spasms are when muscles tighten by themselves. They usually get better within minutes.  Cramps and spasms occur most often in the calf muscles of the leg.  Massage, stretch, and relax the muscle. This may help the cramp or spasm go away.  Drink enough fluid to keep your pee (urine) pale yellow. This information is not intended to replace advice given to you by your health care provider. Make sure you discuss any questions you have with your health care provider. Document Revised: 01/14/2018 Document Reviewed: 01/14/2018 Elsevier Patient Education  [place]. Ondansetron tablets What is this medicine? ONDANSETRON (on DAN  se tron) is used to treat nausea and vomiting caused by chemotherapy. It is also used to prevent or treat nausea and vomiting after surgery. This medicine may be used for other purposes; ask your health care provider or pharmacist if you have questions. COMMON BRAND NAME(S): Zofran What should I tell my health care provider before I take this medicine? They need to know if you have any of these conditions:  heart disease  history of irregular heartbeat  liver disease  low levels of magnesium or potassium in the blood  an unusual or  allergic reaction to ondansetron, granisetron, other medicines, foods, dyes, or preservatives  pregnant or trying to get pregnant  breast-feeding How should I use this medicine? Take this medicine by mouth with a glass of water. Follow the directions on your prescription label. Take your doses at regular intervals. Do not take your medicine more often than directed. Talk to your pediatrician regarding the use of this medicine in children. Special care may be needed. Overdosage: If you think you have taken too much of this medicine contact a poison control center or emergency room at once. NOTE: This medicine is only for you. Do not share this medicine with others. What if I miss a dose? If you miss a dose, take it as soon as you can. If it is almost time for your next dose, take only that dose. Do not take double or extra doses. What may interact with this medicine? Do not take this medicine with any of the following medications:  apomorphine  certain medicines for fungal infections like fluconazole, itraconazole, ketoconazole, posaconazole, voriconazole  cisapride  dronedarone  pimozide  thioridazine This medicine may also interact with the following medications:  carbamazepine  certain medicines for depression, anxiety, or psychotic disturbances  fentanyl  linezolid  MAOIs like Carbex, Eldepryl, Marplan, Nardil, and Parnate  methylene blue (injected into a vein)  other medicines that prolong the QT interval (cause an abnormal heart rhythm) like dofetilide, ziprasidone  phenytoin  rifampicin  tramadol This list may not describe all possible interactions. Give your health care provider a list of all the medicines, herbs, non-prescription drugs, or dietary supplements you use. Also tell them if you smoke, drink alcohol, or use illegal drugs. Some items may interact with your medicine. What should I watch for while using this medicine? Check with your doctor or health  care professional right away if you have any sign of an allergic reaction. What side effects may I notice from receiving this medicine? Side effects that you should report to your doctor or health care professional as soon as possible:  allergic reactions like skin rash, itching or hives, swelling of the face, lips or tongue  breathing problems  confusion  dizziness  fast or irregular heartbeat  feeling faint or lightheaded, falls  fever and chills  loss of balance or coordination  seizures  sweating  swelling of the hands or feet  tightness in the chest  tremors  unusually weak or tired Side effects that usually do not require medical attention (report to your doctor or health care professional if they continue or are bothersome):  constipation or diarrhea  headache This list may not describe all possible side effects. Call your doctor for medical advice about side effects. You may report side effects to FDA at 1-800-FDA-1088. Where should I keep my medicine? Keep out of the reach of children. Store between 2 and 30 degrees C (36 and 86 degrees F). Throw away  any unused medicine after the expiration date. NOTE: This sheet is a summary. It may not cover all possible information. If you have questions about this medicine, talk to your doctor, pharmacist, or health care provider.  2020 Elsevier/Gold Standard (2018-08-13 07:16:43)  Nausea and Vomiting, Adult Nausea is feeling sick to your stomach or feeling that you are about to throw up (vomit). Vomiting is when food in your stomach is thrown up and out of the mouth. Throwing up can make you feel weak. It can also make you lose too much water in your body (get dehydrated). If you lose too much water in your body, you may:  Feel tired.  Feel thirsty.  Have a dry mouth.  Have cracked lips.  Go pee (urinate) less often. Older adults and people with other diseases or a weak body defense system (immune system) are at  higher risk for losing too much water in the body. If you feel sick to your stomach and you throw up, it is important to follow instructions from your doctor about how to take care of yourself. Follow these instructions at home: Watch your symptoms for any changes. Tell your doctor about them. Follow these instructions to care for yourself at home. Eating and drinking      Take an ORS (oral rehydration solution). This is a drink that is sold at pharmacies and stores.  Drink clear fluids in small amounts as you are able, such as: ? Water. ? Ice chips. ? Fruit juice that has water added (diluted fruit juice). ? Low-calorie sports drinks.  Eat bland, easy-to-digest foods in small amounts as you are able, such as: ? Bananas. ? Applesauce. ? Rice. ? Low-fat (lean) meats. ? Toast. ? Crackers.  Avoid drinking fluids that have a lot of sugar or caffeine in them. This includes energy drinks, sports drinks, and soda.  Avoid alcohol.  Avoid spicy or fatty foods. General instructions  Take over-the-counter and prescription medicines only as told by your doctor.  Drink enough fluid to keep your pee (urine) pale yellow.  Wash your hands often with soap and water. If you cannot use soap and water, use hand sanitizer.  Make sure that all people in your home wash their hands well and often.  Rest at home while you get better.  Watch your condition for any changes.  Take slow and deep breaths when you feel sick to your stomach.  Keep all follow-up visits as told by your doctor. This is important. Contact a doctor if:  Your symptoms get worse.  You have new symptoms.  You have a fever.  You cannot drink fluids without throwing up.  You feel sick to your stomach for more than 2 days.  You feel light-headed or dizzy.  You have a headache.  You have muscle cramps.  You have a rash.  You have pain while peeing. Get help right away if:  You have pain in your chest, neck,  arm, or jaw.  You feel very weak or you pass out (faint).  You throw up again and again.  You have throw up that is bright red or looks like black coffee grounds.  You have bloody or black poop (stools) or poop that looks like tar.  You have a very bad headache, a stiff neck, or both.  You have very bad pain, cramping, or bloating in your belly (abdomen).  You have trouble breathing.  You are breathing very quickly.  Your heart is beating very quickly.  Your skin feels cold and clammy.  You feel confused.  You have signs of losing too much water in your body, such as: ? Dark pee, very little pee, or no pee. ? Cracked lips. ? Dry mouth. ? Sunken eyes. ? Sleepiness. ? Weakness. These symptoms may be an emergency. Do not wait to see if the symptoms will go away. Get medical help right away. Call your local emergency services (911 in the [place] Do not drive yourself to the hospital. Summary  Nausea is feeling sick to your stomach or feeling that you are about to throw up (vomit). Vomiting is when food in your stomach is thrown up and out of the mouth.  Follow instructions from your doctor about eating and drinking to keep from losing too much water in your body.  Take over-the-counter and prescription medicines only as told by your doctor.  Contact your doctor if your symptoms get worse or you have new symptoms.  Keep all follow-up visits as told by your doctor. This is important. This information is not intended to replace advice given to you by your health care provider. Make sure you discuss any questions you have with your health care provider. Document Revised: 12/13/2018 Document Reviewed: 01/29/2018 Elsevier Patient Education  2020 Elsevier Inc. Cyclobenzaprine tablets What is this medicine? CYCLOBENZAPRINE (sye kloe BEN za preen) is a muscle relaxer. It is used to treat muscle pain, spasms, and stiffness. This medicine may be used for other purposes; ask your  health care provider or pharmacist if you have questions. COMMON BRAND NAME(S): Fexmid, Flexeril What should I tell my health care provider before I take this medicine? They need to know if you have any of these conditions:  heart disease, irregular heartbeat, or previous heart attack  liver disease  thyroid problem  an unusual or allergic reaction to cyclobenzaprine, tricyclic antidepressants, lactose, other medicines, foods, dyes, or preservatives  pregnant or trying to get pregnant  breast-feeding How should I use this medicine? Take this medicine by mouth with a glass of water. Follow the directions on the prescription label. If this medicine upsets your stomach, take it with food or milk. Take your medicine at regular intervals. Do not take it more often than directed. Talk to your pediatrician regarding the use of this medicine in children. Special care may be needed. Overdosage: If you think you have taken too much of this medicine contact a poison control center or emergency room at once. NOTE: This medicine is only for you. Do not share this medicine with others. What if I miss a dose? If you miss a dose, take it as soon as you can. If it is almost time for your next dose, take only that dose. Do not take double or extra doses. What may interact with this medicine? Do not take this medicine with any of the following medications:  MAOIs like Carbex, Eldepryl, Marplan, Nardil, and Parnate  narcotic medicines for cough  safinamide This medicine may also interact with the following medications:  alcohol  bupropion  antihistamines for allergy, cough and cold  certain medicines for anxiety or sleep  certain medicines for bladder problems like oxybutynin, tolterodine  certain medicines for depression like amitriptyline, fluoxetine, sertraline  certain medicines for Parkinson's disease like benztropine, trihexyphenidyl  certain medicines for seizures like  phenobarbital, primidone  certain medicines for stomach problems like dicyclomine, hyoscyamine  certain medicines for travel sickness like scopolamine  general anesthetics like halothane, isoflurane, methoxyflurane, propofol  ipratropium  local  anesthetics like lidocaine, pramoxine, tetracaine  medicines that relax muscles for surgery  narcotic medicines for pain  phenothiazines like chlorpromazine, mesoridazine, prochlorperazine, thioridazine  verapamil This list may not describe all possible interactions. Give your health care provider a list of all the medicines, herbs, non-prescription drugs, or dietary supplements you use. Also tell them if you smoke, drink alcohol, or use illegal drugs. Some items may interact with your medicine. What should I watch for while using this medicine? Tell your doctor or health care professional if your symptoms do not start to get better or if they get worse. You may get drowsy or dizzy. Do not drive, use machinery, or do anything that needs mental alertness until you know how this medicine affects you. Do not stand or sit up quickly, especially if you are an older patient. This reduces the risk of dizzy or fainting spells. Alcohol may interfere with the effect of this medicine. Avoid alcoholic drinks. If you are taking another medicine that also causes drowsiness, you may have more side effects. Give your health care provider a list of all medicines you use. Your doctor will tell you how much medicine to take. Do not take more medicine than directed. Call emergency for help if you have problems breathing or unusual sleepiness. Your mouth may get dry. Chewing sugarless gum or sucking hard candy, and drinking plenty of water may help. Contact your doctor if the problem does not go away or is severe. What side effects may I notice from receiving this medicine? Side effects that you should report to your doctor or health care professional as soon as  possible:  allergic reactions like skin rash, itching or hives, swelling of the face, lips, or tongue  breathing problems  chest pain  fast, irregular heartbeat  hallucinations  seizures  unusually weak or tired Side effects that usually do not require medical attention (report to your doctor or health care professional if they continue or are bothersome):  headache  nausea, vomiting This list may not describe all possible side effects. Call your doctor for medical advice about side effects. You may report side effects to FDA at 1-800-FDA-1088. Where should I keep my medicine? Keep out of the reach of children. Store at room temperature between 15 and 30 degrees C (59 and 86 degrees F). Keep container tightly closed. Throw away any unused medicine after the expiration date. NOTE: This sheet is a summary. It may not cover all possible information. If you have questions about this medicine, talk to your doctor, pharmacist, or health care provider.  2020 Elsevier/Gold Standard (2018-07-24 12:49:26)

## 2020-02-25 LAB — LIPID PANEL
Chol/HDL Ratio: 2.6 ratio (ref 0.0–4.4)
Cholesterol, Total: 141 mg/dL (ref 100–199)
HDL: 55 mg/dL (ref >39)
LDL Chol Calc (NIH): 72 mg/dL (ref 0–99)
Triglycerides: 71 mg/dL (ref 0–149)
VLDL Cholesterol Cal: 14 mg/dL (ref 5–40)

## 2020-02-25 LAB — CBC WITH DIFFERENTIAL/PLATELET
Basophils Absolute: 0 10*3/uL (ref 0.0–0.2)
Basos: 1 %
EOS (ABSOLUTE): 0 10*3/uL (ref 0.0–0.4)
Eos: 1 %
Hematocrit: 44.7 % (ref 34.0–46.6)
Hemoglobin: 14.7 g/dL (ref 11.1–15.9)
Immature Grans (Abs): 0 10*3/uL (ref 0.0–0.1)
Immature Granulocytes: 0 %
Lymphocytes Absolute: 1.9 10*3/uL (ref 0.7–3.1)
Lymphs: 43 %
MCH: 30.9 pg (ref 26.6–33.0)
MCHC: 32.9 g/dL (ref 31.5–35.7)
MCV: 94 fL (ref 79–97)
Monocytes Absolute: 0.4 10*3/uL (ref 0.1–0.9)
Monocytes: 8 %
Neutrophils Absolute: 2.1 10*3/uL (ref 1.4–7.0)
Neutrophils: 47 %
Platelets: 243 10*3/uL (ref 150–450)
RBC: 4.75 x10E6/uL (ref 3.77–5.28)
RDW: 11.9 % (ref 11.7–15.4)
WBC: 4.5 10*3/uL (ref 3.4–10.8)

## 2020-02-25 LAB — COMPREHENSIVE METABOLIC PANEL
ALT: 20 IU/L (ref 0–32)
AST: 20 IU/L (ref 0–40)
Albumin/Globulin Ratio: 1.5 (ref 1.2–2.2)
Albumin: 4.6 g/dL (ref 3.8–4.8)
Alkaline Phosphatase: 41 IU/L — ABNORMAL LOW (ref 48–121)
BUN/Creatinine Ratio: 8 — ABNORMAL LOW (ref 9–23)
BUN: 6 mg/dL (ref 6–24)
Bilirubin Total: 0.4 mg/dL (ref 0.0–1.2)
CO2: 22 mmol/L (ref 20–29)
Calcium: 9.4 mg/dL (ref 8.7–10.2)
Chloride: 103 mmol/L (ref 96–106)
Creatinine, Ser: 0.79 mg/dL (ref 0.57–1.00)
GFR calc Af Amer: 108 mL/min/{1.73_m2} (ref >59)
GFR calc non Af Amer: 93 mL/min/{1.73_m2} (ref >59)
Globulin, Total: 3 g/dL (ref 1.5–4.5)
Glucose: 86 mg/dL (ref 65–99)
Potassium: 3.8 mmol/L (ref 3.5–5.2)
Sodium: 138 mmol/L (ref 134–144)
Total Protein: 7.6 g/dL (ref 6.0–8.5)

## 2020-02-25 LAB — VITAMIN B12: Vitamin B-12: 524 pg/mL (ref 232–1245)

## 2020-02-25 LAB — TSH: TSH: 0.758 u[IU]/mL (ref 0.450–4.500)

## 2020-02-25 LAB — VITAMIN D 25 HYDROXY (VIT D DEFICIENCY, FRACTURES): Vit D, 25-Hydroxy: 25.2 ng/mL — ABNORMAL LOW (ref 30.0–100.0)

## 2020-02-25 NOTE — Progress Notes (Signed)
Patient notified, verbally understood.

## 2020-02-26 LAB — H. PYLORI BREATH TEST: H pylori Breath Test: NEGATIVE

## 2020-02-27 NOTE — Progress Notes (Signed)
Patient notified of results, verbally understood.

## 2020-03-03 ENCOUNTER — Telehealth: Payer: Self-pay | Admitting: Family Medicine

## 2020-03-03 ENCOUNTER — Other Ambulatory Visit: Payer: Self-pay | Admitting: Family Medicine

## 2020-03-03 DIAGNOSIS — T7840XA Allergy, unspecified, initial encounter: Secondary | ICD-10-CM

## 2020-03-03 NOTE — Telephone Encounter (Signed)
Done

## 2020-03-04 ENCOUNTER — Other Ambulatory Visit: Payer: Medicaid Other

## 2020-03-04 ENCOUNTER — Other Ambulatory Visit: Payer: Self-pay

## 2020-03-04 DIAGNOSIS — T7840XA Allergy, unspecified, initial encounter: Secondary | ICD-10-CM | POA: Diagnosis not present

## 2020-03-09 LAB — FOOD ALLERGY PROFILE
Allergen Corn, IgE: <0.1 kU/L
Clam IgE: 0.93 kU/L — AB
Codfish IgE: <0.1 kU/L
Egg White IgE: <0.1 kU/L
Milk IgE: <0.1 kU/L
Peanut IgE: <0.1 kU/L
Scallop IgE: 0.11 kU/L — AB
Sesame Seed IgE: <0.1 kU/L
Shrimp IgE: 0.67 kU/L — AB
Soybean IgE: <0.1 kU/L
Walnut IgE: <0.1 kU/L
Wheat IgE: 1.23 kU/L — AB

## 2020-03-16 ENCOUNTER — Ambulatory Visit (INDEPENDENT_AMBULATORY_CARE_PROVIDER_SITE_OTHER): Payer: Medicaid Other | Admitting: Family Medicine

## 2020-03-16 ENCOUNTER — Other Ambulatory Visit: Payer: Self-pay

## 2020-03-16 DIAGNOSIS — G629 Polyneuropathy, unspecified: Secondary | ICD-10-CM

## 2020-03-16 DIAGNOSIS — Z09 Encounter for follow-up examination after completed treatment for conditions other than malignant neoplasm: Secondary | ICD-10-CM

## 2020-03-16 DIAGNOSIS — R112 Nausea with vomiting, unspecified: Secondary | ICD-10-CM | POA: Diagnosis not present

## 2020-03-16 DIAGNOSIS — T7840XA Allergy, unspecified, initial encounter: Secondary | ICD-10-CM

## 2020-03-16 NOTE — Progress Notes (Signed)
Virtual Visit via Telephone Note  I connected with Haley Reyes on 03/16/20 at  2:00 PM EDT by telephone and verified that I am speaking with the correct person using two identifiers.   I discussed the limitations, risks, security and privacy concerns of performing an evaluation and management service by telephone and the availability of in person appointments. I also discussed with the patient that there may be a patient responsible charge related to this service. The patient expressed understanding and agreed to proceed.  Televisit Today Patient Location: Home Provider Location: Office  History of Present Illness: Past Surgical History:  Procedure Laterality Date  . CHOLECYSTECTOMY  2000  . cystic fibroids    . TUBAL LIGATION      Social History   Socioeconomic History  . Marital status: Single    Spouse name: Not on file  . Number of children: 4  . Years of education: Not on file  . Highest education level: Not on file  Occupational History  . Occupation: unemployed    Associate Professor: NOT EMPLOYED  Tobacco Use  . Smoking status: Current Every Day Smoker    Packs/day: 0.50    Types: Cigarettes    Start date: 09/04/1998  . Smokeless tobacco: Former Clinical biochemist  . Vaping Use: Never used  Substance and Sexual Activity  . Alcohol use: Yes    Comment: glass of red wine occassionally during the week. None at the time d/t new medications.   . Drug use: No  . Sexual activity: Not Currently  Other Topics Concern  . Not on file  Social History Narrative  . Not on file   Social Determinants of Health   Financial Resource Strain:   . Difficulty of Paying Living Expenses:   Food Insecurity:   . Worried About Programme researcher, broadcasting/film/video in the Last Year:   . Barista in the Last Year:   Transportation Needs:   . Freight forwarder (Medical):   Marland Kitchen Lack of Transportation (Non-Medical):   Physical Activity:   . Days of Exercise per Week:   . Minutes of Exercise per  Session:   Stress:   . Feeling of Stress :   Social Connections:   . Frequency of Communication with Friends and Family:   . Frequency of Social Gatherings with Friends and Family:   . Attends Religious Services:   . Active Member of Clubs or Organizations:   . Attends Banker Meetings:   Marland Kitchen Marital Status:   Intimate Partner Violence:   . Fear of Current or Ex-Partner:   . Emotionally Abused:   Marland Kitchen Physically Abused:   . Sexually Abused:    Family History  Problem Relation Age of Onset  . Lung cancer Father   . Hypertension Father   . COPD Maternal Grandmother   . Congestive Heart Failure Maternal Grandmother   . Allergies Other        children x 4  . Asthma Other        children x 4  . Diabetes Other        fathers siblings  . Allergies Brother   . Congestive Heart Failure Other        aunt  . Sleep apnea Brother   . Hypertension Mother     Past Medical History:  Diagnosis Date  . Angina at rest [facility])   . Anxiety   . Arthritis   . Fibromyalgia   . Insomnia   .  Neuropathy   . PTSD (Reyes-traumatic stress disorder)     Patient Active Problem List   Diagnosis Date Noted  . Chronic bilateral low back pain without sciatica 02/24/2020  . Neuropathy 08/01/2017  . Fatigue 08/01/2017  . Fibromyalgia 07/23/2013  . Osteoarthritis of both knees 07/23/2013  . Rotator cuff tendonitis 07/23/2013  . 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 Inadequate sleep hygiene 01/06/2013    Allergies  Allergen Reactions  . Shrimp [Shellfish Allergy]   . Wheat Bran   . Benadryl [Diphenhydramine Hcl]     unknown  . Codeine Other (See Comments)    Sweating real bad felt like she was going to pass out   . Hydrocodone-Ibuprofen Palpitations  . Latex Itching and Rash  . Other Nausea And Vomiting and Anxiety    *Caffine*    Current Outpatient Medications on File Prior to Visit  Medication Sig Dispense Refill  . cyclobenzaprine (FLEXERIL) 10 MG tablet Take 1 tablet  (10 mg total) by mouth 3 (three) times daily as needed for muscle spasms. 30 tablet 0  . escitalopram (LEXAPRO) 10 MG tablet Take one tab daily 30 tablet 2  . gabapentin (NEURONTIN) 400 MG capsule Take 1 capsule (400 mg total) by mouth at bedtime. 30 capsule 2   No current facility-administered medications on file prior to visit.    Current Status: Since her last office visit, she is inquiring about recent results of Allergy tests. She states that she has swelling, abdominal pain, and discomfort over the past years. She is schedule with Allergist on 04/21/2020. Her anxiety is moderate today, r/t many allergies. She denies fevers, chills, fatigue, recent infections, weight loss, and night sweats. She has not had any headaches, visual changes, dizziness, and falls. No chest pain, heart palpitations, cough and shortness of breath reported. Denies GI problems such as nausea, vomiting, diarrhea, and constipation. She has no reports of blood in stools, dysuria and hematuria. No depression or anxiety, and denies suicidal ideations, homicidal ideations, or auditory hallucinations. She is taking all medications as prescribed.  Observations/Objective:  Telephone Visit   Assessment and Plan:  1. Allergy, initial encounter  2. Neuropathy  3. Nausea and vomiting, intractability of vomiting not specified, unspecified vomiting type Stable today.  4. Follow up She will keep follow up appointment .   No orders of the defined types were placed in this encounter.   No orders of the defined types were placed in this encounter.   Referral Orders  No referral(s) requested today    Raliegh Ip,  MSN, FNP-BC [facility] [address] [place], [place] [address] [phone] fax    I discussed the assessment and treatment plan with the patient. The patient was provided an opportunity to ask  questions and all were answered. The patient agreed with the plan and demonstrated an understanding of the instructions.   The patient was advised to call back or seek an in-person evaluation if the symptoms worsen or if the condition fails to improve as anticipated.  I provided 20 minutes of non-face-to-face time during this encounter.   [name], FNP

## 2020-03-19 ENCOUNTER — Other Ambulatory Visit: Payer: Self-pay | Admitting: Family Medicine

## 2020-03-19 ENCOUNTER — Telehealth (HOSPITAL_COMMUNITY): Payer: Self-pay | Admitting: Family Medicine

## 2020-03-19 DIAGNOSIS — Z889 Allergy status to unspecified drugs, medicaments and biological substances status: Secondary | ICD-10-CM

## 2020-03-19 DIAGNOSIS — T7840XA Allergy, unspecified, initial encounter: Secondary | ICD-10-CM

## 2020-03-19 MED ORDER — EPINEPHRINE 0.3 MG/0.3ML IJ SOAJ: 0.3000 mg | INTRAMUSCULAR | 1 refills | Status: DC | PRN

## 2020-03-19 NOTE — Telephone Encounter (Signed)
Pt would like a pers. for an epipen at your earliest convenience

## 2020-04-13 ENCOUNTER — Telehealth (HOSPITAL_COMMUNITY): Payer: Medicaid Other | Admitting: Psychiatry

## 2020-04-13 ENCOUNTER — Other Ambulatory Visit: Payer: Self-pay

## 2020-04-19 ENCOUNTER — Telehealth (HOSPITAL_COMMUNITY): Payer: Medicaid Other | Admitting: Psychiatry

## 2020-04-21 ENCOUNTER — Ambulatory Visit: Payer: Medicaid Other | Admitting: Allergy

## 2020-05-26 ENCOUNTER — Ambulatory Visit: Payer: Self-pay | Admitting: Family Medicine

## 2020-06-09 ENCOUNTER — Ambulatory Visit: Payer: Medicaid Other | Admitting: Allergy

## 2020-07-26 ENCOUNTER — Other Ambulatory Visit: Payer: Self-pay | Admitting: Family Medicine

## 2020-07-26 DIAGNOSIS — Z1231 Encounter for screening mammogram for malignant neoplasm of breast: Secondary | ICD-10-CM

## 2020-08-03 ENCOUNTER — Other Ambulatory Visit: Payer: Self-pay

## 2020-08-03 ENCOUNTER — Encounter (HOSPITAL_COMMUNITY): Payer: Self-pay | Admitting: Psychiatry

## 2020-08-03 ENCOUNTER — Telehealth (INDEPENDENT_AMBULATORY_CARE_PROVIDER_SITE_OTHER): Payer: Medicaid Other | Admitting: Psychiatry

## 2020-08-03 DIAGNOSIS — F331 Major depressive disorder, recurrent, moderate: Secondary | ICD-10-CM

## 2020-08-03 DIAGNOSIS — F4310 Post-traumatic stress disorder, unspecified: Secondary | ICD-10-CM

## 2020-08-03 MED ORDER — GABAPENTIN 400 MG PO CAPS: 400.0000 mg | ORAL_CAPSULE | Freq: Every day | ORAL | 1 refills | Status: DC

## 2020-08-03 MED ORDER — ESCITALOPRAM OXALATE 10 MG PO TABS: ORAL_TABLET | ORAL | 1 refills | Status: DC
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
## 2020-08-10 ENCOUNTER — Ambulatory Visit: Payer: Self-pay | Admitting: Family Medicine

## 2020-08-30 ENCOUNTER — Ambulatory Visit
Admission: RE | Admit: 2020-08-30 | Discharge: 2020-08-30 | Disposition: A | Discharge status: Home / Self Care | Payer: Medicaid Other | Source: Ambulatory Visit | Attending: Family Medicine | Admitting: Family Medicine

## 2020-08-30 ENCOUNTER — Other Ambulatory Visit: Payer: Self-pay

## 2020-08-30 DIAGNOSIS — Z1231 Encounter for screening mammogram for malignant neoplasm of breast: Secondary | ICD-10-CM

## 2020-09-09 ENCOUNTER — Ambulatory Visit: Payer: Medicaid Other

## 2020-09-22 ENCOUNTER — Other Ambulatory Visit: Payer: Self-pay

## 2020-09-22 ENCOUNTER — Telehealth (HOSPITAL_COMMUNITY): Payer: Medicaid Other | Admitting: Psychiatry

## 2020-09-30 ENCOUNTER — Other Ambulatory Visit: Payer: Self-pay

## 2020-09-30 ENCOUNTER — Telehealth (INDEPENDENT_AMBULATORY_CARE_PROVIDER_SITE_OTHER): Payer: Medicaid Other | Admitting: Psychiatry

## 2020-09-30 ENCOUNTER — Encounter (HOSPITAL_COMMUNITY): Payer: Self-pay | Admitting: Psychiatry

## 2020-09-30 DIAGNOSIS — F331 Major depressive disorder, recurrent, moderate: Secondary | ICD-10-CM

## 2020-09-30 DIAGNOSIS — F4310 Post-traumatic stress disorder, unspecified: Secondary | ICD-10-CM

## 2020-09-30 MED ORDER — GABAPENTIN 400 MG PO CAPS: 400.0000 mg | ORAL_CAPSULE | Freq: Every day | ORAL | 2 refills | Status: DC

## 2020-09-30 MED ORDER — ESCITALOPRAM OXALATE 10 MG PO TABS
ORAL_TABLET | ORAL | 2 refills | Status: DC
Start: 2020-09-30 — End: 2020-12-27
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
## 2020-10-06 ENCOUNTER — Ambulatory Visit: Payer: Medicaid Other | Admitting: Allergy

## 2020-11-09 ENCOUNTER — Telehealth: Payer: Self-pay

## 2020-11-09 NOTE — Telephone Encounter (Signed)
Question on medications that she wanted to ask you.

## 2020-11-18 ENCOUNTER — Telehealth: Payer: Self-pay

## 2020-11-18 NOTE — Telephone Encounter (Signed)
Called pt to mk a tele visit per Shary Key.

## 2020-12-23 ENCOUNTER — Telehealth (INDEPENDENT_AMBULATORY_CARE_PROVIDER_SITE_OTHER): Payer: Medicaid Other | Admitting: Nurse Practitioner

## 2020-12-23 ENCOUNTER — Encounter: Payer: Self-pay | Admitting: Nurse Practitioner

## 2020-12-23 DIAGNOSIS — J301 Allergic rhinitis due to pollen: Secondary | ICD-10-CM

## 2020-12-23 MED ORDER — PSEUDOEPHEDRINE-GUAIFENESIN ER 60-600 MG PO TB12: 1.0000 | ORAL_TABLET | Freq: Two times a day (BID) | ORAL | 0 refills | Status: AC

## 2020-12-23 MED ORDER — BENZONATATE 100 MG PO CAPS: 100.0000 mg | ORAL_CAPSULE | Freq: Three times a day (TID) | ORAL | 0 refills | Status: AC | PRN

## 2020-12-23 NOTE — Progress Notes (Signed)
   [facility] Patient [facility] [address] [name], [place]  [address] Phone:  [phone]   Fax:  [phone]  Virtual Visit via Video Note  I connected with Haley Reyes on 12/23/20 at  3:20 PM EDT by video and verified that I am speaking with the correct person using two identifiers.   I discussed the limitations, risks, security and privacy concerns of performing an evaluation and management service by telephone and the availability of in person appointments. I also discussed with the patient that there may be a patient responsible charge related to this service. The patient expressed understanding and agreed to proceed.  Patient home Provider Office  History of Present Illness:  Cough Upper Respiratory Infection Patient complains of symptoms of a URI. Symptoms include non productive cough, barky cough. Onset of symptoms was a few days ago, and has been gradually worsening since that time. Treatment to date: antihistamines and cough suppressants.  Review of Systems  Constitutional: Negative for chills and fever.  HENT: Negative for congestion.     Observations/Objective: Assessment  Primary Diagnosis & Pertinent Problem List: The encounter diagnosis was Seasonal allergic rhinitis due to pollen.  Visit Diagnosis: 1. Seasonal allergic rhinitis due to pollen  Encouraged Mucinex D Benzonatate 100 mg 3 times daily as needed for cough    Assessment and Plan:   Follow Up Instructions: Follow-up as scheduled   I discussed the assessment and treatment plan with the patient. The patient was provided an opportunity to ask questions and all were answered. The patient agreed with the plan and demonstrated an understanding of the instructions.   The patient was advised to call back or seek an in-person evaluation if the symptoms worsen or if the condition fails to improve as anticipated.  I provided 9 minutes of video- face-to-face time during this encounter.   Barbette Merino, NP

## 2020-12-27 ENCOUNTER — Telehealth (INDEPENDENT_AMBULATORY_CARE_PROVIDER_SITE_OTHER): Payer: Medicaid Other | Admitting: Psychiatry

## 2020-12-27 ENCOUNTER — Other Ambulatory Visit: Payer: Self-pay

## 2020-12-27 DIAGNOSIS — F331 Major depressive disorder, recurrent, moderate: Secondary | ICD-10-CM | POA: Diagnosis not present

## 2020-12-27 DIAGNOSIS — F4310 Post-traumatic stress disorder, unspecified: Secondary | ICD-10-CM

## 2020-12-27 MED ORDER — ESCITALOPRAM OXALATE 20 MG PO TABS: 20.0000 mg | ORAL_TABLET | Freq: Every day | ORAL | 2 refills | Status: DC

## 2020-12-27 MED ORDER — GABAPENTIN 400 MG PO CAPS: 400.0000 mg | ORAL_CAPSULE | Freq: Every day | ORAL | 2 refills | Status: DC
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
## 2021-01-02 DIAGNOSIS — H5213 Myopia, bilateral: Secondary | ICD-10-CM | POA: Diagnosis not present

## 2021-03-25 ENCOUNTER — Telehealth: Payer: Self-pay

## 2021-03-25 NOTE — Telephone Encounter (Signed)
Pt needs another referral   [facility]

## 2021-03-28 ENCOUNTER — Encounter (HOSPITAL_COMMUNITY): Payer: Self-pay | Admitting: Psychiatry

## 2021-03-28 ENCOUNTER — Telehealth (INDEPENDENT_AMBULATORY_CARE_PROVIDER_SITE_OTHER): Payer: Medicaid Other | Admitting: Psychiatry

## 2021-03-28 ENCOUNTER — Other Ambulatory Visit: Payer: Self-pay

## 2021-03-28 DIAGNOSIS — F4310 Post-traumatic stress disorder, unspecified: Secondary | ICD-10-CM | POA: Diagnosis not present

## 2021-03-28 DIAGNOSIS — F331 Major depressive disorder, recurrent, moderate: Secondary | ICD-10-CM

## 2021-03-28 MED ORDER — GABAPENTIN 400 MG PO CAPS
400.0000 mg | ORAL_CAPSULE | Freq: Every day | ORAL | 2 refills | Status: DC
Start: 2021-03-28 — End: 2021-06-28

## 2021-03-28 MED ORDER — ESCITALOPRAM OXALATE 20 MG PO TABS
20.0000 mg | ORAL_TABLET | Freq: Every day | ORAL | 2 refills | Status: DC
Start: 2021-03-28 — End: 2021-06-28

## 2021-03-28 NOTE — Telephone Encounter (Signed)
Left voice mail to call back 
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
## 2021-03-29 NOTE — Telephone Encounter (Signed)
Left voice mail

## 2021-04-08 ENCOUNTER — Other Ambulatory Visit: Payer: Self-pay | Admitting: Nurse Practitioner

## 2021-04-08 ENCOUNTER — Telehealth: Payer: Self-pay | Admitting: Family Medicine

## 2021-04-08 MED ORDER — DICLOFENAC SODIUM 1 % EX GEL: 4.0000 g | Freq: Four times a day (QID) | CUTANEOUS | 2 refills | Status: AC

## 2021-04-08 MED ORDER — LIDOCAINE 5 % EX PTCH: 1.0000 | MEDICATED_PATCH | CUTANEOUS | 0 refills | Status: AC

## 2021-04-08 NOTE — Telephone Encounter (Signed)
Pt states she is currently having a lot of back pain and in the past she's used volaren gel and lidocaine patches. Pt asking if Provider could send in Lidocaine patches to [facility] at [address] [place], [place] [address] so she can be able to use this over the weekend. Please advise and thank you.   *Pt believed she had an appt scheduled for Sept but none was scheduled. An appt was made for October since it's the closest one in the morning (per patient's preference) but would like to have appt rescheduled to Sept when this is available.

## 2021-04-13 ENCOUNTER — Telehealth: Payer: Self-pay

## 2021-04-13 NOTE — Telephone Encounter (Signed)
Prior Auth initiated via Cover my meds for Lidocaine Patches.  Key: [id number] - PA Case ID: [id number]

## 2021-04-14 NOTE — Telephone Encounter (Signed)
PA Case: [id number], Status: Denied.

## 2021-04-14 NOTE — Telephone Encounter (Signed)
Call return, patient need therapy appointment and asking if ok to try melatonin. Recommended to try up to 5 mg and will refer to therapy.

## 2021-04-22 ENCOUNTER — Telehealth: Payer: Self-pay

## 2021-04-22 NOTE — Telephone Encounter (Signed)
[facility] is needing [facility] the one in the system has expired. Thanks

## 2021-04-23 ENCOUNTER — Other Ambulatory Visit: Payer: Self-pay | Admitting: Nurse Practitioner

## 2021-04-23 DIAGNOSIS — Z889 Allergy status to unspecified drugs, medicaments and biological substances status: Secondary | ICD-10-CM

## 2021-05-13 ENCOUNTER — Ambulatory Visit (INDEPENDENT_AMBULATORY_CARE_PROVIDER_SITE_OTHER): Payer: Medicaid Other | Admitting: Nurse Practitioner

## 2021-05-13 ENCOUNTER — Other Ambulatory Visit: Payer: Self-pay

## 2021-05-13 ENCOUNTER — Encounter (HOSPITAL_COMMUNITY): Payer: Self-pay

## 2021-05-13 ENCOUNTER — Emergency Department (HOSPITAL_COMMUNITY)
Admission: EM | Admit: 2021-05-13 | Discharge: 2021-05-13 | Discharge status: Against Medical Advice | Payer: Medicaid Other | Attending: Emergency Medicine | Admitting: Emergency Medicine

## 2021-05-13 ENCOUNTER — Encounter: Payer: Self-pay | Admitting: Nurse Practitioner

## 2021-05-13 ENCOUNTER — Emergency Department (HOSPITAL_COMMUNITY): Payer: Medicaid Other

## 2021-05-13 VITALS — BP 109/66 | HR 76 | Temp 97.2°F | Ht 60.0 in | Wt 119.0 lb

## 2021-05-13 DIAGNOSIS — Z0000 Encounter for general adult medical examination without abnormal findings: Secondary | ICD-10-CM | POA: Diagnosis not present

## 2021-05-13 DIAGNOSIS — M79672 Pain in left foot: Secondary | ICD-10-CM

## 2021-05-13 DIAGNOSIS — M79671 Pain in right foot: Secondary | ICD-10-CM | POA: Insufficient documentation

## 2021-05-13 DIAGNOSIS — R3915 Urgency of urination: Secondary | ICD-10-CM

## 2021-05-13 DIAGNOSIS — Z87891 Personal history of nicotine dependence: Secondary | ICD-10-CM | POA: Diagnosis not present

## 2021-05-13 DIAGNOSIS — Z91040 Latex allergy status: Secondary | ICD-10-CM | POA: Insufficient documentation

## 2021-05-13 DIAGNOSIS — R202 Paresthesia of skin: Secondary | ICD-10-CM | POA: Insufficient documentation

## 2021-05-13 LAB — POCT GLYCOSYLATED HEMOGLOBIN (HGB A1C): Hemoglobin A1C: 5.2 % (ref 4.0–5.6)

## 2021-05-13 NOTE — Progress Notes (Signed)
[facility] Patient [facility] [address] Haley Reyes [place], [place]  [address] Phone:  [phone]   Fax:  [phone] Subjective:   Patient ID: Haley Reyes, female    DOB: [birth year], 45 y.o.   MRN: [id number]  Chief Complaint  Patient presents with   Follow-up    Feet pain, was told it was arthritis in right foot. Left heel pain   HPI NYISHA CLIPPARD 45 y.o. female with history of fibromyalgia, osteoarthritis of both knees, neuropathy, rotator cuff tendonitis and chronic bilateral low back pain without sciatica to the [facility] for bilateral foot pain. Patient states that she has had pain for several week . Pain is 10/10 and describes as sharp, aching and throbbing. Pain starts near toes and radiates to the heel. Pain is worse in the morning and increases with pressure. Currently not working due to disability caused by fibromyalgia. Has been taking regularly prescribed medications with no improvement in pain.  Patient states that she regularly wear flat shoes.   Also complaining of urine leakage with some frequency and urgency. States, "Sometimes the urine drips out on my way to the bathroom." Symptoms began 2 yrs ago. Denies any burning or other discomfort with urination. Has never been evaluated or treated for symptoms. Denies any abdominal pain. Denies any fever, chest pain or shortness of breath. States that she has four children.   Past Medical History:  Diagnosis Date   Angina at rest [facility])    Anxiety    Arthritis    Fibromyalgia    Insomnia    Neuropathy    PTSD (post-traumatic stress disorder)     Past Surgical History:  Procedure Laterality Date   CHOLECYSTECTOMY  2000   cystic fibroids     TUBAL LIGATION      Family History  Problem Relation Age of Onset   Lung cancer Father    Hypertension Father    COPD Maternal Grandmother    Congestive Heart Failure Maternal Grandmother    Allergies Other        children x 4   Asthma Other        children x 4   Diabetes  Other        fathers siblings   Allergies Brother    Congestive Heart Failure Other        aunt   Sleep apnea Brother    Hypertension Mother     Social History   Socioeconomic History   Marital status: Single    Spouse name: Not on file   Number of children: 4   Years of education: Not on file   Highest education level: Not on file  Occupational History   Occupation: unemployed    Employer: NOT EMPLOYED  Tobacco Use   Smoking status: Former    Packs/day: 0.50    Types: Cigarettes    Start date: 09/04/1998    Quit date: 05/06/2021    Years since quitting: 0.0   Smokeless tobacco: Former  Building services engineer Use: Never used  Substance and Sexual Activity   Alcohol use: Yes    Comment: glass of red wine occassionally during the week. None at the time d/t new medications.    Drug use: No   Sexual activity: Not Currently  Other Topics Concern   Not on file  Social History Narrative   Not on file   Social Determinants of Health   Financial Resource Strain: Not on file  Food Insecurity: Not  on file  Transportation Needs: Not on file  Physical Activity: Not on file  Stress: Not on file  Social Connections: Not on file  Intimate Partner Violence: Not on file    Outpatient Medications Prior to Visit  Medication Sig Dispense Refill   diclofenac Sodium (VOLTAREN) 1 % GEL Apply 4 g topically 4 (four) times daily. 100 g 2   EPINEPHrine 0.3 mg/0.3 mL IJ SOAJ injection Inject 0.3 mLs (0.3 mg total) into the muscle as needed for anaphylaxis. 1 each 1   escitalopram (LEXAPRO) 20 MG tablet Take 1 tablet (20 mg total) by mouth daily. 30 tablet 2   gabapentin (NEURONTIN) 400 MG capsule Take 1 capsule (400 mg total) by mouth at bedtime. 30 capsule 2   cyclobenzaprine (FLEXERIL) 10 MG tablet Take 1 tablet (10 mg total) by mouth 3 (three) times daily as needed for muscle spasms. (Patient not taking: No sig reported) 30 tablet 0   RESTASIS 0.05 % ophthalmic emulsion 1 drop 2 (two) times  daily.     No facility-administered medications prior to visit.    Allergies  Allergen Reactions   Shrimp [Shellfish Allergy]    Wheat Bran    Benadryl [Diphenhydramine Hcl]     unknown   Codeine Other (See Comments)    Sweating real bad felt like she was going to pass out    Hydrocodone-Ibuprofen Palpitations   Latex Itching and Rash   Other Nausea And Vomiting and Anxiety    *Caffine*    Review of Systems  Constitutional:  Negative for chills, fever and malaise/fatigue.  Respiratory:  Negative for cough and shortness of breath.   Cardiovascular:  Negative for chest pain, palpitations and leg swelling.  Gastrointestinal:  Negative for abdominal pain, blood in stool, constipation, diarrhea, nausea and vomiting.  Genitourinary:        See HPI  Musculoskeletal:        See HPI  Skin: Negative.   Neurological: Negative.   Psychiatric/Behavioral:  Negative for depression. The patient is not nervous/anxious.   All other systems reviewed and are negative.     Objective:    Physical Exam Vitals reviewed.  Constitutional:      General: She is not in acute distress.    Appearance: Normal appearance.  HENT:     Head: Normocephalic.  Cardiovascular:     Rate and Rhythm: Normal rate and regular rhythm.     Pulses: Normal pulses.     Heart sounds: Normal heart sounds.     Comments: No obvious peripheral edema. Distal pulses intact  Pulmonary:     Effort: Pulmonary effort is normal.     Breath sounds: Normal breath sounds.  Abdominal:     General: Abdomen is flat. Bowel sounds are normal.     Palpations: Abdomen is soft.     Tenderness: There is no right CVA tenderness or left CVA tenderness.  Musculoskeletal:        General: No swelling, tenderness, deformity or signs of injury. Normal range of motion.     Comments: Diffuse bilateral feet non tender to palpation   Skin:    General: Skin is warm and dry.     Capillary Refill: Capillary refill takes less than 2 seconds.   Neurological:     General: No focal deficit present.     Mental Status: She is alert and oriented to person, place, and time. Mental status is at baseline.  Psychiatric:        Mood and  Affect: Mood normal.        Behavior: Behavior normal.        Thought Content: Thought content normal.        Judgment: Judgment normal.    BP 109/66   Pulse 76   Temp (!) 97.2 F (36.2 C)   Ht 5' (1.524 m)   Wt 119 lb 0.6 oz (54 kg)   LMP 05/12/2021   SpO2 97%   BMI 23.25 kg/m  Wt Readings from Last 3 Encounters:  05/13/21 119 lb 0.6 oz (54 kg)  02/24/20 123 lb 0.4 oz (55.8 kg)  11/15/19 136 lb (61.7 kg)    There is no immunization history for the selected administration types on file for this patient.  Diabetic Foot Exam - Simple   No data filed     Lab Results  Component Value Date   TSH 0.758 02/24/2020   Lab Results  Component Value Date   WBC 4.5 02/24/2020   HGB 14.7 02/24/2020   HCT 44.7 02/24/2020   MCV 94 02/24/2020   PLT 243 02/24/2020   Lab Results  Component Value Date   NA 138 02/24/2020   K 3.8 02/24/2020   CO2 22 02/24/2020   GLUCOSE 86 02/24/2020   BUN 6 02/24/2020   CREATININE 0.79 02/24/2020   BILITOT 0.4 02/24/2020   ALKPHOS 41 (L) 02/24/2020   AST 20 02/24/2020   ALT 20 02/24/2020   PROT 7.6 02/24/2020   ALBUMIN 4.6 02/24/2020   CALCIUM 9.4 02/24/2020   ANIONGAP 7 07/20/2015   Lab Results  Component Value Date   CHOL 141 02/24/2020   CHOL 150 04/05/2018   Lab Results  Component Value Date   HDL 55 02/24/2020   HDL 55 04/05/2018   Lab Results  Component Value Date   LDLCALC 72 02/24/2020   LDLCALC 79 04/05/2018   Lab Results  Component Value Date   TRIG 71 02/24/2020   TRIG 82 04/05/2018   Lab Results  Component Value Date   CHOLHDL 2.6 02/24/2020   CHOLHDL 2.7 04/05/2018   Lab Results  Component Value Date   HGBA1C 5.2 05/13/2021   HGBA1C 5.1 04/05/2018   HGBA1C 5.3 08/01/2017       Assessment & Plan:   Problem List  Items Addressed This Visit   None Visit Diagnoses     Healthcare maintenance    -  Primary   Relevant Orders   CBC with Differential/Platelet   Comprehensive metabolic panel   Lipid panel   POCT glycosylated hemoglobin (Hb A1C) (Completed)   Urinary urgency     Concerned for infectious process v idiopathic/age related urinary incontinence  Provided education on potential causes Discussed non pharmacological methods for management Will review labs and treat further as needed   Bilateral foot pain       Relevant Orders   Ambulatory referral to Physical Therapy   DG Foot Complete Left   DG Foot Complete Right Concerned for plantar fasciitis v arthralgia.  Educated non pharmacological methods for managing pain  Encouraged usage of OTC medications as needed for pain     Follow up in 3 mths for reevaluation of symptoms and Pap smear    I am having Kristia B. Gironda maintain her cyclobenzaprine, EPINEPHrine, Restasis, gabapentin, escitalopram, and diclofenac Sodium.  No orders of the defined types were placed in this encounter.    Kathrynn Speed, NP

## 2021-05-13 NOTE — Patient Instructions (Addendum)
You were seen today in the [facility] for bilateral foot pain and urinary incontinence. Labs were collected, results will be available via MyChart or, if abnormal, you will be contacted by clinic staff.  Please follow up in 3 mth for reevaluation and Pap smear. Xray was ordered, you will be called if results are abnormal. Referral sent for PT, they will call you for an appointment.

## 2021-05-13 NOTE — ED Triage Notes (Signed)
Pt arrived via POV, c/o bilateral foot pain. Requesting xrays

## 2021-05-13 NOTE — ED Notes (Signed)
Patient could not sign AMA forms due to equipment failure.

## 2021-05-13 NOTE — ED Provider Notes (Signed)
[name] Provider Note   CSN: [id number] Arrival date & time: 05/13/21  1114     History Chief Complaint  Patient presents with   Foot Problem    Haley Reyes is a 45 y.o. female with a past medical history of neuropathy and fibromyalgia presenting today from her primary care provider for x-rays of her feet.  She says that her primary care office sent her here to get the scans done so that they could know if she has plantar fasciitis.  Reports that she has had pain in her feet for months however over these past few weeks it is gotten worse.  She states that the pain is worse when she is walking for long periods of time, getting out of the car or standing up to get up in the morning.  Denies difficulty walking.  Says that she has some numbness however reports that she has had numbness and tingling in all of her limbs after being diagnosed with neuropathy in the past.       Past Medical History:  Diagnosis Date   Angina at rest [facility])    Anxiety    Arthritis    Fibromyalgia    Insomnia    Neuropathy    PTSD (post-traumatic stress disorder)     Patient Active Problem List   Diagnosis Date Noted   Chronic bilateral low back pain without sciatica 02/24/2020   Neuropathy 08/01/2017   Fatigue 08/01/2017   Fibromyalgia 07/23/2013   Osteoarthritis of both knees 07/23/2013   Rotator cuff tendonitis 07/23/2013   Insomnia, psychophysiological 01/06/2013   Inadequate sleep hygiene 01/06/2013    Past Surgical History:  Procedure Laterality Date   CHOLECYSTECTOMY  2000   cystic fibroids     TUBAL LIGATION       OB History   No obstetric history on file.     Family History  Problem Relation Age of Onset   Lung cancer Father    Hypertension Father    COPD Maternal Grandmother    Congestive Heart Failure Maternal Grandmother    Allergies Other        children x 4   Asthma Other        children x 4   Diabetes Other        fathers  siblings   Allergies Brother    Congestive Heart Failure Other        aunt   Sleep apnea Brother    Hypertension Mother     Social History   Tobacco Use   Smoking status: Former    Packs/day: 0.50    Types: Cigarettes    Start date: 09/04/1998    Quit date: 05/06/2021    Years since quitting: 0.0   Smokeless tobacco: Former  Building services engineer Use: Never used  Substance Use Topics   Alcohol use: Yes    Comment: glass of red wine occassionally during the week. None at the time d/t new medications.    Drug use: No    Home Medications Prior to Admission medications   Medication Sig Start Date End Date Taking? Authorizing Provider  cyclobenzaprine (FLEXERIL) 10 MG tablet Take 1 tablet (10 mg total) by mouth 3 (three) times daily as needed for muscle spasms. Patient not taking: No sig reported 02/24/20   [name], FNP  diclofenac Sodium (VOLTAREN) 1 % GEL Apply 4 g topically 4 (four) times daily. 04/08/21 07/07/21  Barbette Merino, NP  EPINEPHrine 0.3 mg/0.3 mL IJ SOAJ injection Inject 0.3 mLs (0.3 mg total) into the muscle as needed for anaphylaxis. 03/19/20   [name], FNP  escitalopram (LEXAPRO) 20 MG tablet Take 1 tablet (20 mg total) by mouth daily. 03/28/21 03/28/22  Arfeen, Phillips Grout, MD  gabapentin (NEURONTIN) 400 MG capsule Take 1 capsule (400 mg total) by mouth at bedtime. 03/28/21   Arfeen, Phillips Grout, MD  RESTASIS 0.05 % ophthalmic emulsion 1 drop 2 (two) times daily. 12/17/20   [provider]    Allergies    Shrimp [shellfish allergy], Wheat bran, Benadryl [diphenhydramine hcl], Codeine, Hydrocodone-ibuprofen, Latex, and Other  Review of Systems   Review of Systems  Constitutional:  Negative for activity change.  Eyes:  Negative for pain and visual disturbance.  Respiratory:  Negative for cough and shortness of breath.   Cardiovascular:  Negative for chest pain and palpitations.  Musculoskeletal:  Positive for gait problem. Negative for back pain.   Skin:  Negative for color change and rash.  Neurological:  Positive for numbness. Negative for dizziness and light-headedness.  All other systems reviewed and are negative.  Physical Exam Updated Vital Signs BP 113/87 (BP Location: Left Arm)   Pulse 60   Temp 98 F (36.7 C) (Oral)   Resp 16   LMP 05/12/2021   SpO2 100%   Physical Exam Vitals and nursing note reviewed.  Constitutional:      Appearance: Normal appearance.  HENT:     Head: Normocephalic and atraumatic.  Eyes:     General: No scleral icterus.    Conjunctiva/sclera: Conjunctivae normal.  Pulmonary:     Effort: Pulmonary effort is normal. No respiratory distress.  Musculoskeletal:        General: Tenderness (MCP of right foot.) present. Normal range of motion.  Skin:    General: Skin is warm and dry.     Findings: No rash.  Neurological:     Mental Status: She is alert.     Sensory: No sensory deficit.     Motor: No weakness.     Gait: Gait normal.     Comments: Patient able to ambulate with normal gait.  Denies any pain.  Psychiatric:        Mood and Affect: Mood normal.    ED Results / Procedures / Treatments   Labs (all labs ordered are listed, but only abnormal results are displayed) Labs Reviewed - No data to display  EKG None  Radiology No results found.  Procedures Procedures   Medications Ordered in ED Medications - No data to display  ED Course  I have reviewed the triage vital signs and the nursing notes.  Pertinent labs & imaging results that were available during my care of the patient were reviewed by me and considered in my medical decision making (see chart for details).    MDM Rules/Calculators/A&P Patient a 45 year old female with a past medical history of fibromyalgia, neuropathy and chronic foot pain presenting today for bilateral x-rays of her feet.  Patient reports primary care provider sent her to the emergency department to obtain these radiographs.  Upon first  interaction patient asked me how long this was going to take out because she was hungry and ready to leave.  I told her that I was unable to give her a timeline for her x-rays however I would prefer if she waited for the results.  She says that she is unable to wait for very long but would attempt to  do so if the results came back quickly.  When I passed by the x-ray tech in the hallway she reported that the patient was signing out AMA.  I spoke with the nurse who will initiate this process.   Final Clinical Impression(s) / ED Diagnoses Final diagnoses:  Pain in both feet    Rx / DC Orders Patient left AMA reporting that she has been in doctors offices all day and is hungry.  She will be able to see the results in her chart for primary care to follow-up with.    Saddie Benders, PA-C 05/13/21 1401    Franne Forts, DO 05/24/21 1931

## 2021-05-14 LAB — LIPID PANEL
Chol/HDL Ratio: 2.4 ratio (ref 0.0–4.4)
Cholesterol, Total: 144 mg/dL (ref 100–199)
HDL: 60 mg/dL (ref >39)
LDL Chol Calc (NIH): 69 mg/dL (ref 0–99)
Triglycerides: 77 mg/dL (ref 0–149)
VLDL Cholesterol Cal: 15 mg/dL (ref 5–40)

## 2021-05-14 LAB — COMPREHENSIVE METABOLIC PANEL
ALT: 13 IU/L (ref 0–32)
AST: 14 IU/L (ref 0–40)
Albumin/Globulin Ratio: 1.7 (ref 1.2–2.2)
Albumin: 4.4 g/dL (ref 3.8–4.8)
Alkaline Phosphatase: 41 IU/L — ABNORMAL LOW (ref 44–121)
BUN/Creatinine Ratio: 12 (ref 9–23)
BUN: 9 mg/dL (ref 6–24)
Bilirubin Total: 0.2 mg/dL (ref 0.0–1.2)
CO2: 24 mmol/L (ref 20–29)
Calcium: 9.5 mg/dL (ref 8.7–10.2)
Chloride: 103 mmol/L (ref 96–106)
Creatinine, Ser: 0.74 mg/dL (ref 0.57–1.00)
Globulin, Total: 2.6 g/dL (ref 1.5–4.5)
Glucose: 84 mg/dL (ref 65–99)
Potassium: 4.2 mmol/L (ref 3.5–5.2)
Sodium: 139 mmol/L (ref 134–144)
Total Protein: 7 g/dL (ref 6.0–8.5)
eGFR: 103 mL/min/{1.73_m2} (ref >59)

## 2021-05-14 LAB — CBC WITH DIFFERENTIAL/PLATELET
Basophils Absolute: 0 10*3/uL (ref 0.0–0.2)
Basos: 1 %
EOS (ABSOLUTE): 0.1 10*3/uL (ref 0.0–0.4)
Eos: 1 %
Hematocrit: 37.5 % (ref 34.0–46.6)
Hemoglobin: 12.5 g/dL (ref 11.1–15.9)
Immature Grans (Abs): 0 10*3/uL (ref 0.0–0.1)
Immature Granulocytes: 0 %
Lymphocytes Absolute: 2.1 10*3/uL (ref 0.7–3.1)
Lymphs: 44 %
MCH: 30.6 pg (ref 26.6–33.0)
MCHC: 33.3 g/dL (ref 31.5–35.7)
MCV: 92 fL (ref 79–97)
Monocytes Absolute: 0.4 10*3/uL (ref 0.1–0.9)
Monocytes: 8 %
Neutrophils Absolute: 2.2 10*3/uL (ref 1.4–7.0)
Neutrophils: 46 %
Platelets: 277 10*3/uL (ref 150–450)
RBC: 4.09 x10E6/uL (ref 3.77–5.28)
RDW: 12 % (ref 11.7–15.4)
WBC: 4.7 10*3/uL (ref 3.4–10.8)

## 2021-05-31 ENCOUNTER — Ambulatory Visit: Payer: Medicaid Other

## 2021-06-03 ENCOUNTER — Other Ambulatory Visit: Payer: Self-pay | Admitting: Nurse Practitioner

## 2021-06-03 ENCOUNTER — Telehealth: Payer: Self-pay

## 2021-06-03 DIAGNOSIS — M79671 Pain in right foot: Secondary | ICD-10-CM

## 2021-06-03 DIAGNOSIS — M797 Fibromyalgia: Secondary | ICD-10-CM

## 2021-06-03 DIAGNOSIS — M79642 Pain in left hand: Secondary | ICD-10-CM

## 2021-06-03 DIAGNOSIS — M79641 Pain in right hand: Secondary | ICD-10-CM

## 2021-06-03 MED ORDER — MELOXICAM 15 MG PO TABS
15.0000 mg | ORAL_TABLET | Freq: Every day | ORAL | 0 refills | Status: AC
Start: 2021-06-03 — End: 2021-07-03

## 2021-06-08 ENCOUNTER — Ambulatory Visit: Payer: Self-pay | Admitting: Nurse Practitioner

## 2021-06-15 ENCOUNTER — Telehealth: Payer: Self-pay | Admitting: Nurse Practitioner

## 2021-06-28 ENCOUNTER — Other Ambulatory Visit: Payer: Self-pay

## 2021-06-28 ENCOUNTER — Encounter (HOSPITAL_COMMUNITY): Payer: Self-pay | Admitting: Psychiatry

## 2021-06-28 ENCOUNTER — Ambulatory Visit (INDEPENDENT_AMBULATORY_CARE_PROVIDER_SITE_OTHER): Payer: Medicaid Other | Admitting: Allergy and Immunology

## 2021-06-28 ENCOUNTER — Telehealth (HOSPITAL_BASED_OUTPATIENT_CLINIC_OR_DEPARTMENT_OTHER): Payer: Medicaid Other | Admitting: Psychiatry

## 2021-06-28 VITALS — Wt 128.0 lb

## 2021-06-28 VITALS — BP 114/66 | HR 92 | Temp 98.0°F | Resp 18 | Ht 61.0 in | Wt 128.4 lb

## 2021-06-28 DIAGNOSIS — L500 Allergic urticaria: Secondary | ICD-10-CM | POA: Diagnosis not present

## 2021-06-28 DIAGNOSIS — T781XXD Other adverse food reactions, not elsewhere classified, subsequent encounter: Secondary | ICD-10-CM | POA: Diagnosis not present

## 2021-06-28 DIAGNOSIS — F331 Major depressive disorder, recurrent, moderate: Secondary | ICD-10-CM

## 2021-06-28 DIAGNOSIS — J3089 Other allergic rhinitis: Secondary | ICD-10-CM | POA: Diagnosis not present

## 2021-06-28 DIAGNOSIS — R140 Abdominal distension (gaseous): Secondary | ICD-10-CM | POA: Diagnosis not present

## 2021-06-28 DIAGNOSIS — F4310 Post-traumatic stress disorder, unspecified: Secondary | ICD-10-CM

## 2021-06-28 DIAGNOSIS — J301 Allergic rhinitis due to pollen: Secondary | ICD-10-CM | POA: Diagnosis not present

## 2021-06-28 DIAGNOSIS — H1010 Acute atopic conjunctivitis, unspecified eye: Secondary | ICD-10-CM

## 2021-06-28 DIAGNOSIS — G4720 Circadian rhythm sleep disorder, unspecified type: Secondary | ICD-10-CM

## 2021-06-28 DIAGNOSIS — H1013 Acute atopic conjunctivitis, bilateral: Secondary | ICD-10-CM | POA: Diagnosis not present

## 2021-06-28 MED ORDER — OLOPATADINE HCL 0.2 % OP SOLN: 1.0000 [drp] | OPHTHALMIC | 5 refills | Status: DC

## 2021-06-28 MED ORDER — TRAZODONE HCL 50 MG PO TABS
50.0000 mg | ORAL_TABLET | Freq: Every evening | ORAL | 2 refills | Status: DC | PRN
Start: 2021-06-28 — End: 2022-10-09

## 2021-06-28 MED ORDER — GABAPENTIN 400 MG PO CAPS: 400.0000 mg | ORAL_CAPSULE | Freq: Every day | ORAL | 2 refills | Status: DC

## 2021-06-28 MED ORDER — CYPROHEPTADINE HCL 4 MG PO TABS: 4.0000 mg | ORAL_TABLET | Freq: Every day | ORAL | 5 refills | Status: DC

## 2021-06-28 MED ORDER — ESCITALOPRAM OXALATE 20 MG PO TABS: 20.0000 mg | ORAL_TABLET | Freq: Every day | ORAL | 2 refills | Status: DC

## 2021-06-28 MED ORDER — TRIAMCINOLONE ACETONIDE 55 MCG/ACT NA AERO: 2.0000 | INHALATION_SPRAY | Freq: Every day | NASAL | 5 refills | Status: DC

## 2021-06-28 MED ORDER — LORATADINE 10 MG PO TABS
10.0000 mg | ORAL_TABLET | Freq: Two times a day (BID) | ORAL | 5 refills | Status: DC | PRN
Start: 2021-06-28 — End: 2021-11-15

## 2021-06-28 NOTE — Progress Notes (Addendum)
[place] - [place] - [place] - [place] - [place]   Dear Haley Reyes,  Thank you for referring Haley Reyes to the [facility] [place] on 06/28/2021.   Below is a summation of this patient's evaluation and recommendations.  Thank you for your referral. I will keep you informed about this patient's response to treatment.   If you have any questions please do not hesitate to contact me.   Sincerely,  Jessica Priest, MD Allergy / Immunology [facility]   ______________________________________________________________________    NEW PATIENT NOTE  Referring Provider: Barbette Merino, NP Primary Provider: Kathrynn Speed, NP Date of office visit: 06/28/2021    Subjective:   Chief Complaint:  Haley Reyes (DOB: Aug 22, 1976) is a 45 y.o. female who presents to the clinic on 06/28/2021 with a chief complaint of Allergy Testing (Wants to confirm allergies for foods and environmentals. ) .     HPI: Haley Reyes presents to this clinic in evaluation of multiple issues.  First, she complains of recurrent bloating and discomfort in her abdomen and gas for which she has had a thorough gastrointestinal evaluation in the past..  She questions whether or not she is having a food allergy contributing to this issue.  She does appear to have problems when drinking dairy.  This definitely upsets her stomach.  She has tried lactose-free milk but this still upsets her stomach.  She has basically completely dairy free at this point in time.  Second, when she eats banana she gets an intensely itchy mouth and itchy throat and her lips swell.  Third, she has runny nose and nasal congestion and sneezing and some itchy red watery eyes especially following exposure to pollen and dust for which she will take an antihistamine, usually Claritin, on occasion.  She apparently has dry eye syndrome and she has been on some  drops which she does not use on a consistent basis that were given to her to her by her ophthalmologist.  Fourth, she gets these itchy red spots on her body that she calls "whelps" that will last several hours and are not associated with any systemic or constitutional symptoms and she wonders if this is secondary to a food allergy.  Fortunately, over the course of the past several months this has not been active.  Fifth, she has issues with fatigue and tiredness and she has been diagnosed with fibromyalgia.  She sees a psychologist for posttraumatic stress disorder and other issues and has been given various medications including antidepressants.  She has very disrupted sleep.  She has difficulty falling asleep and sometimes she does not think that she sleeps the whole night.  She has difficulty maintaining sleep.  And then nurse times of the day where she just has overwhelming sleep and can fall asleep at any point.  Sixth, she did recently just discontinued her tobacco hobby of many years.  She stopped smoking in September 2022.   Past Medical History:  Diagnosis Date   Angina at rest [facility])    Anxiety    Arthritis    Fibromyalgia    Insomnia    Neuropathy    PTSD (Reyes-traumatic stress disorder)     Past Surgical History:  Procedure Laterality Date   CHOLECYSTECTOMY  2000   cystic fibroids     TUBAL LIGATION      Allergies as of 06/28/2021       Reactions  Shrimp [shellfish Allergy]    Wheat Bran    Benadryl [diphenhydramine Hcl]    unknown   Codeine Other (See Comments)   Sweating real bad felt like she was going to pass out    Hydrocodone-ibuprofen Palpitations   Latex Itching, Rash   Other Nausea And Vomiting, Anxiety   *Caffine*        Medication List    cyclobenzaprine 10 MG tablet Commonly known as: FLEXERIL Take 1 tablet (10 mg total) by mouth 3 (three) times daily as needed for muscle spasms.   diclofenac Sodium 1 % Gel Commonly known as: Voltaren Apply  4 g topically 4 (four) times daily.   EPINEPHrine 0.3 mg/0.3 mL Soaj injection Commonly known as: EPI-PEN Inject 0.3 mLs (0.3 mg total) into the muscle as needed for anaphylaxis.   escitalopram 20 MG tablet Commonly known as: Lexapro Take 1 tablet (20 mg total) by mouth daily.   gabapentin 400 MG capsule Commonly known as: NEURONTIN Take 1 capsule (400 mg total) by mouth at bedtime.   loratadine 10 MG tablet Commonly known as: Claritin Take 1 tablet (10 mg total) by mouth 2 (two) times daily as needed for allergies (Can use an extra dose during flare ups). Started by: Jessica Priest, MD   meloxicam 15 MG tablet Commonly known as: Mobic Take 1 tablet (15 mg total) by mouth daily.   Olopatadine HCl 0.2 % Soln Commonly known as: Pataday Place 1 drop into both eyes 1 day or 1 dose. Started by: Jessica Priest, MD   Restasis 0.05 % ophthalmic emulsion Generic drug: cycloSPORINE 1 drop 2 (two) times daily.   traZODone 50 MG tablet Commonly known as: DESYREL Take 1 tablet (50 mg total) by mouth at bedtime as needed for sleep. Started by: Cleotis Nipper, MD   triamcinolone 55 MCG/ACT Aero nasal inhaler Commonly known as: NASACORT Place 2 sprays into the nose daily. Started by: Jessica Priest, MD    Review of systems negative except as noted in HPI / PMHx or noted below:  Review of Systems  Constitutional: Negative.   HENT: Negative.    Eyes: Negative.   Respiratory: Negative.    Cardiovascular: Negative.   Gastrointestinal: Negative.   Genitourinary: Negative.   Musculoskeletal: Negative.   Skin: Negative.   Neurological: Negative.   Endo/Heme/Allergies: Negative.   Psychiatric/Behavioral: Negative.     Family History  Problem Relation Age of Onset   Lung cancer Father    Hypertension Father    COPD Maternal Grandmother    Congestive Heart Failure Maternal Grandmother    Allergies Other        children x 4   Asthma Other        children x 4   Diabetes Other         fathers siblings   Allergies Brother    Congestive Heart Failure Other        aunt   Sleep apnea Brother    Hypertension Mother     Social History   Socioeconomic History   Marital status: Single    Spouse name: Not on file   Number of children: 4   Years of education: Not on file   Highest education level: Not on file  Occupational History   Occupation: unemployed    Employer: NOT EMPLOYED  Tobacco Use   Smoking status: Former    Packs/day: 0.50    Types: Cigarettes    Start date: 09/04/1998    Quit  date: 05/06/2021    Years since quitting: 0.1   Smokeless tobacco: Former  Building services engineer Use: Never used  Substance and Sexual Activity   Alcohol use: Yes    Comment: glass of red wine occassionally during the week. None at the time d/t new medications.    Drug use: No   Sexual activity: Not Currently  Other Topics Concern   Not on file  Social History Narrative   Not on file   Environmental and Social history  Lives in a house with a dry environment, dog located inside the household, carpet in the bedroom, no plastic on the bed, no plastic on the pillow, and exposure to tobacco smoke inside the household.  Objective:   Vitals:   06/28/21 0939  BP: 114/66  Pulse: 92  Resp: 18  Temp: 98 F (36.7 C)  SpO2: 100%   Height: 5\' 1"  (154.9 cm) Weight: 128 lb 6.4 oz (58.2 kg)  Physical Exam Constitutional:      Appearance: She is not diaphoretic.  HENT:     Head: Normocephalic.     Right Ear: Tympanic membrane, ear canal and external ear normal.     Left Ear: Tympanic membrane, ear canal and external ear normal.     Nose: Nose normal. No mucosal edema or rhinorrhea.     Mouth/Throat:     Pharynx: Uvula midline. No oropharyngeal exudate.  Eyes:     Conjunctiva/sclera: Conjunctivae normal.  Neck:     Thyroid: No thyromegaly.     Trachea: Trachea normal. No tracheal tenderness or tracheal deviation.  Cardiovascular:     Rate and Rhythm: Normal rate and  regular rhythm.     Heart sounds: Normal heart sounds, S1 normal and S2 normal. No murmur heard. Pulmonary:     Effort: No respiratory distress.     Breath sounds: Normal breath sounds. No stridor. No wheezing or rales.  Lymphadenopathy:     Head:     Right side of head: No tonsillar adenopathy.     Left side of head: No tonsillar adenopathy.     Cervical: No cervical adenopathy.  Skin:    Findings: No erythema or rash.     Nails: There is no clubbing.  Neurological:     Mental Status: She is alert.    Diagnostics: Allergy skin tests were performed.  She had very significant hypersensitivity directed against multiple pollens including trees, grasses, and weeds.  She also demonstrated hypersensitivity against dust mite.  She also had very slight hypersensitivity against several foods including watermelon, cabbage, peanut, egg.  She did not demonstrate any hypersensitivity against shellfish or peanuts or nuts.  Results of blood tests obtained 04 March 2020 identifies IgE antibodies directed against clam 0.93 KU/L, shrimp 0.67 KU/L, scallop 0.11 KU/L, wheat 1.23 KU/L, negative egg, milk, peanut, walnut, soybean, corn, codfish, sesame seed.  Results of blood tests obtained 13 May 2021 identifies WBC 4.7, absolute eosinophil 100, absolute lymphocyte 2100, hemoglobin 12.5, platelet 277, creatinine 0.74 mg/DL, AST 14 U/L, ALT 13 U/L  Assessment and Plan:    1. Perennial allergic rhinitis   2. Seasonal allergic rhinitis due to pollen   3. Seasonal allergic conjunctivitis   4. Adverse food reaction, subsequent encounter   5. Pollen-food allergy, subsequent encounter   6. Allergic urticaria   7. Dysfunction of sleep stage or arousal     1.  Allergen avoidance measures - dairy, wheat, banana, pollens, dust mite  2.  Treat and prevent  inflammation:  A.  Flonase or OTC Nasacort - 1-2 sprays each nostril 3-7 times per week  3. Treat and prevent sleep dysfunction:  A. Periactin 4 mg -  1/2 - 1 tablet at bedtime  4. Use nicotine substitutes to eliminate smoke exposure  5. If needed:  A. Epi-pen, benadryl, MD/ER evaluation for allergic reaction. B. Claritin / loratadine 10 mg - 1 tablet 1 time per day (dry eye???) C. Pataday - 1 drop each eye 1 time per day  6. Further evaluation???  7. Obtain fall flu vacine  8. Return to clinic in 4 weeks or earlier if problem  It does appear that Mayuri has a very atopic active immune system that is contributing to some of her symptoms especially her respiratory tract symptoms and to some degree it may also be contributing to her GI symptoms and her intermittent urticaria.  She is probably having some sensitivity to specific food consumption secondary to pollen cross-reactivity with proteins contained within those specific foods.  For now we are just going to have her eliminate dairy wheat banana and of course perform some allergen avoidance measures against pollens and dust mite.  We are not going to restrict her diet to anything else at this point in time.  She can use a nasal steroid to address her upper airway inflammation.  She has very significant sleep dysfunction and I am going to start her on Periactin at a relatively low dose to see if we can reset her sleep cycle.  And she recently discontinued her tobacco hobby and I gave her some suggestions about using nicotine substitutes instead of cigarettes if she needs to restart this hobby.  I will see her back in this clinic in 4 weeks or earlier if there is a problem.  Jessica Priest, MD Allergy / Immunology [facility] [place]
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
## 2021-06-28 NOTE — Patient Instructions (Addendum)
  1.  Allergen avoidance measures - dairy, wheat, banana, pollens, dust mite  2.  Treat and prevent inflammation:  A.  Flonase or OTC Nasacort - 1-2 sprays each nostril 3-7 times per week  3. Treat and prevent sleep dysfunction:  A. Periactin 4 mg - 1/2 - 1 tablet at bedtime  4. Use nicotine substitutes to eliminate smoke exposure  5. If needed:  A. Epi-pen, benadryl, MD/ER evaluation for allergic reaction. B. Claritin / loratadine 10 mg - 1 tablet 1 time per day (dry eye???) C. Pataday - 1 drop each eye 1 time per day  6. Further evaluation???  7. Obtain fall flu vacine  8. Return to clinic in 4 weeks or earlier if problem

## 2021-06-29 ENCOUNTER — Encounter: Payer: Self-pay | Admitting: Allergy and Immunology

## 2021-07-01 ENCOUNTER — Telehealth: Payer: Self-pay | Admitting: Allergy and Immunology

## 2021-07-01 NOTE — Telephone Encounter (Signed)
Spoke with Haley Reyes, she just had a question about the histamine on her allergy test.

## 2021-07-01 NOTE — Telephone Encounter (Signed)
Patient would like someone to go over her allergy test results from her New Patient appointment on 06/28/21.

## 2021-07-14 ENCOUNTER — Encounter: Payer: Self-pay | Admitting: Nurse Practitioner

## 2021-07-14 ENCOUNTER — Ambulatory Visit (INDEPENDENT_AMBULATORY_CARE_PROVIDER_SITE_OTHER): Payer: Medicaid Other | Admitting: Nurse Practitioner

## 2021-07-14 ENCOUNTER — Other Ambulatory Visit: Payer: Self-pay

## 2021-07-14 VITALS — BP 110/79 | HR 79 | Temp 97.9°F | Ht 60.0 in | Wt 129.0 lb

## 2021-07-14 DIAGNOSIS — M797 Fibromyalgia: Secondary | ICD-10-CM

## 2021-07-14 DIAGNOSIS — N39498 Other specified urinary incontinence: Secondary | ICD-10-CM

## 2021-07-14 DIAGNOSIS — M79671 Pain in right foot: Secondary | ICD-10-CM | POA: Diagnosis not present

## 2021-07-14 DIAGNOSIS — M722 Plantar fascial fibromatosis: Secondary | ICD-10-CM

## 2021-07-14 DIAGNOSIS — M79672 Pain in left foot: Secondary | ICD-10-CM

## 2021-07-14 DIAGNOSIS — G5601 Carpal tunnel syndrome, right upper limb: Secondary | ICD-10-CM

## 2021-07-14 NOTE — Patient Instructions (Signed)
You were seen today in the [facility] for pain and reduced strength in right wrist . Please continue taking medications as prescribed.  Please follow up in 1 mth for pap smear.

## 2021-07-14 NOTE — Progress Notes (Signed)
Haley Reyes, [place]  [address] Phone:  [phone]   Fax:  [phone] Subjective:   Patient ID: Haley Reyes, female    DOB: 1975/12/08, 45 y.o.   MRN: [id number]  Chief Complaint  Patient presents with   Follow-up    Back pain, right hand issues having trouble opening bottles.  Patient stated she was told she has plantar fasciitis, pain in mainly right foot.    HPI Haley Reyes 45 y.o. female  has a past medical history of Angina at rest [facility]), Anxiety, Arthritis, Fibromyalgia, Insomnia, Neuropathy, and PTSD (post-traumatic stress disorder). To the [facility] for continued pain in right foot and lower back. States that also has been having difficulty opening bottles with right hand.  Pain in right improves intermittently with prescribed Meloxicam and changes in footwear, but continues to occur. Denies any pain during visit today. Denies completing PT, states that she never received notice to complete appointment after referral was made.  Have not started PT, never received appointment with PT after referral sent. Has been taking meloxicam for pain, with intermittent improvement. Denies any pain during visit. Also endorses worsening of chronic back pain. States that pain related to "pinched nerve", fibromyalgia and arthralgia. Has received treatment for chronic pain in the past, but not recently.   Verbalizes having chronic carpal tunnel that has worsened. She is left hand dominant, but often uses right hand. Has been unable to grip items without dropping them or open jars.   Also endorses continues urinary incontinence, has been using non pharmacological methods at home with no improvement. States that she has leakage of urine at times when attempting to ambulate to the bathroom.  Denies any other complaints during today's visit. Denies any fatigue, chest pain, shortness of breath, HA or dizziness. Denies any blurred vision, numbness or  tingling.   Past Medical History:  Diagnosis Date   Angina at rest [facility])    Anxiety    Arthritis    Fibromyalgia    Insomnia    Neuropathy    PTSD (post-traumatic stress disorder)     Past Surgical History:  Procedure Laterality Date   CHOLECYSTECTOMY  2000   cystic fibroids     TUBAL LIGATION      Family History  Problem Relation Age of Onset   Lung cancer Father    Hypertension Father    COPD Maternal Grandmother    Congestive Heart Failure Maternal Grandmother    Allergies Other        children x 4   Asthma Other        children x 4   Diabetes Other        fathers siblings   Allergies Brother    Congestive Heart Failure Other        aunt   Sleep apnea Brother    Hypertension Mother     Social History   Socioeconomic History   Marital status: Single    Spouse name: Not on file   Number of children: 4   Years of education: Not on file   Highest education level: Not on file  Occupational History   Occupation: unemployed    Employer: NOT EMPLOYED  Tobacco Use   Smoking status: Former    Packs/day: 0.50    Types: Cigarettes    Start date: 09/04/1998    Quit date: 05/06/2021    Years since quitting: 0.1   Smokeless tobacco: Former  Electronics engineer  Use   Vaping Use: Never used  Substance and Sexual Activity   Alcohol use: Yes    Comment: glass of red wine occassionally during the week. None at the time d/t new medications.    Drug use: No   Sexual activity: Not Currently  Other Topics Concern   Not on file  Social History Narrative   Not on file   Social Determinants of Health   Financial Resource Strain: Not on file  Food Insecurity: Not on file  Transportation Needs: Not on file  Physical Activity: Not on file  Stress: Not on file  Social Connections: Not on file  Intimate Partner Violence: Not on file    Outpatient Medications Prior to Visit  Medication Sig Dispense Refill   cyclobenzaprine (FLEXERIL) 10 MG tablet Take 1 tablet (10 mg total) by  mouth 3 (three) times daily as needed for muscle spasms. 30 tablet 0   EPINEPHrine 0.3 mg/0.3 mL IJ SOAJ injection Inject 0.3 mLs (0.3 mg total) into the muscle as needed for anaphylaxis. 1 each 1   escitalopram (LEXAPRO) 20 MG tablet Take 1 tablet (20 mg total) by mouth daily. 30 tablet 2   gabapentin (NEURONTIN) 400 MG capsule Take 1 capsule (400 mg total) by mouth at bedtime. 30 capsule 2   loratadine (CLARITIN) 10 MG tablet Take 1 tablet (10 mg total) by mouth 2 (two) times daily as needed for allergies (Can use an extra dose during flare ups). 60 tablet 5   Olopatadine HCl (PATADAY) 0.2 % SOLN Place 1 drop into both eyes 1 day or 1 dose. 2.5 mL 5   traZODone (DESYREL) 50 MG tablet Take 1 tablet (50 mg total) by mouth at bedtime as needed for sleep. 30 tablet 2   triamcinolone (NASACORT) 55 MCG/ACT AERO nasal inhaler Place 2 sprays into the nose daily. 16.5 g 5   cyproheptadine (PERIACTIN) 4 MG tablet Take 1 tablet (4 mg total) by mouth at bedtime. Take a half a tablet or a whole tablet. (Patient not taking: Reported on 07/14/2021) 30 tablet 5   RESTASIS 0.05 % ophthalmic emulsion 1 drop 2 (two) times daily.     No facility-administered medications prior to visit.    Allergies  Allergen Reactions   Shrimp [Shellfish Allergy]    Wheat Bran    Benadryl [Diphenhydramine Hcl]     unknown   Codeine Other (See Comments)    Sweating real bad felt like she was going to pass out    Hydrocodone-Ibuprofen Palpitations   Latex Itching and Rash   Other Nausea And Vomiting and Anxiety    *Caffine*    Review of Systems  Constitutional:  Negative for chills, fever and malaise/fatigue.  HENT: Negative.    Respiratory:  Negative for cough and shortness of breath.   Cardiovascular:  Negative for chest pain, palpitations and leg swelling.  Gastrointestinal:  Negative for abdominal pain, blood in stool, constipation, diarrhea, nausea and vomiting.  Genitourinary:  Negative for dysuria, flank pain,  frequency, hematuria and urgency.       See HPI  Musculoskeletal:  Positive for back pain.       See HPI  Skin: Negative.   Neurological:  Positive for weakness.       See HPI  Psychiatric/Behavioral:  Negative for depression. The patient is not nervous/anxious.   All other systems reviewed and are negative.     Objective:    Physical Exam Vitals reviewed.  Constitutional:      General:  She is not in acute distress.    Appearance: Normal appearance.  HENT:     Head: Normocephalic.  Cardiovascular:     Rate and Rhythm: Normal rate and regular rhythm.     Pulses: Normal pulses.     Heart sounds: Normal heart sounds.     Comments: No obvious peripheral edema Pulmonary:     Effort: Pulmonary effort is normal.     Breath sounds: Normal breath sounds.  Musculoskeletal:        General: No swelling, tenderness, deformity or signs of injury. Normal range of motion.     Right hand: No swelling, deformity or tenderness. Normal range of motion. Decreased strength.     Left hand: No swelling, deformity or tenderness. Normal range of motion.     Cervical back: Normal, normal range of motion and neck supple.     Thoracic back: Normal.     Lumbar back: Normal.     Right lower leg: No edema.     Left lower leg: No edema.     Right foot: Normal.     Left foot: Normal.  Skin:    General: Skin is warm and dry.     Capillary Refill: Capillary refill takes less than 2 seconds.  Neurological:     General: No focal deficit present.     Mental Status: She is alert and oriented to person, place, and time.  Psychiatric:        Mood and Affect: Mood normal.        Behavior: Behavior normal.        Thought Content: Thought content normal.        Judgment: Judgment normal.    BP 110/79   Pulse 79   Temp 97.9 F (36.6 C)   Ht 5' (1.524 m)   Wt 129 lb 0.2 oz (58.5 kg)   SpO2 100%   BMI 25.20 kg/m  Wt Readings from Last 3 Encounters:  07/14/21 129 lb 0.2 oz (58.5 kg)  06/28/21 128 lb  6.4 oz (58.2 kg)  05/13/21 119 lb 0.6 oz (54 kg)    There is no immunization history for the selected administration types on file for this patient.  Diabetic Foot Exam - Simple   No data filed     Lab Results  Component Value Date   TSH 0.758 02/24/2020   Lab Results  Component Value Date   WBC 4.7 05/13/2021   HGB 12.5 05/13/2021   HCT 37.5 05/13/2021   MCV 92 05/13/2021   PLT 277 05/13/2021   Lab Results  Component Value Date   NA 139 05/13/2021   K 4.2 05/13/2021   CO2 24 05/13/2021   GLUCOSE 84 05/13/2021   BUN 9 05/13/2021   CREATININE 0.74 05/13/2021   BILITOT 0.2 05/13/2021   ALKPHOS 41 (L) 05/13/2021   AST 14 05/13/2021   ALT 13 05/13/2021   PROT 7.0 05/13/2021   ALBUMIN 4.4 05/13/2021   CALCIUM 9.5 05/13/2021   ANIONGAP 7 07/20/2015   EGFR 103 05/13/2021   Lab Results  Component Value Date   CHOL 144 05/13/2021   CHOL 141 02/24/2020   CHOL 150 04/05/2018   Lab Results  Component Value Date   HDL 60 05/13/2021   HDL 55 02/24/2020   HDL 55 04/05/2018   Lab Results  Component Value Date   LDLCALC 69 05/13/2021   LDLCALC 72 02/24/2020   LDLCALC 79 04/05/2018   Lab Results  Component Value Date  TRIG 77 05/13/2021   TRIG 71 02/24/2020   TRIG 82 04/05/2018   Lab Results  Component Value Date   CHOLHDL 2.4 05/13/2021   CHOLHDL 2.6 02/24/2020   CHOLHDL 2.7 04/05/2018   Lab Results  Component Value Date   HGBA1C 5.2 05/13/2021   HGBA1C 5.1 04/05/2018   HGBA1C 5.3 08/01/2017       Assessment & Plan:   Problem List Items Addressed This Visit       Other   Fibromyalgia - Primary   Relevant Orders   Ambulatory referral to Pain Clinic   Other Visit Diagnoses     Bilateral foot pain       Relevant Orders   Ambulatory referral to Orthopedic Surgery Patient informed to continue previous treatment plan Discussed non pharmacological methods for management   Carpal tunnel syndrome of right wrist       Relevant Orders    Ambulatory referral to Orthopedic Surgery Discussed obtaining wrist splint to assist with management of carpal tunnel    Plantar fasciitis       Relevant Orders   Ambulatory referral to Orthopedic Surgery Encouraged to make appointment with PT    Other urinary incontinence       Relevant Orders   Ambulatory referral to Urogynecology Informed to continue previous treatment plan Discussed non pharmacological methods for management   Follow up in 1 mth for pap smear, sooner as needed     I am having Shastina B. Font maintain her cyclobenzaprine, EPINEPHrine, Restasis, cyproheptadine, triamcinolone, loratadine, Olopatadine HCl, gabapentin, escitalopram, and traZODone.  No orders of the defined types were placed in this encounter.    Teena Dunk, NP

## 2021-07-21 ENCOUNTER — Ambulatory Visit: Payer: Medicaid Other | Admitting: Orthopedic Surgery

## 2021-08-02 ENCOUNTER — Ambulatory Visit (INDEPENDENT_AMBULATORY_CARE_PROVIDER_SITE_OTHER): Payer: Medicaid Other | Admitting: Orthopedic Surgery

## 2021-08-02 ENCOUNTER — Encounter: Payer: Self-pay | Admitting: Orthopedic Surgery

## 2021-08-02 ENCOUNTER — Other Ambulatory Visit: Payer: Self-pay

## 2021-08-02 VITALS — BP 109/71 | HR 65 | Ht 60.0 in | Wt 129.0 lb

## 2021-08-02 DIAGNOSIS — R200 Anesthesia of skin: Secondary | ICD-10-CM | POA: Diagnosis not present

## 2021-08-02 DIAGNOSIS — R202 Paresthesia of skin: Secondary | ICD-10-CM

## 2021-08-02 DIAGNOSIS — G5601 Carpal tunnel syndrome, right upper limb: Secondary | ICD-10-CM | POA: Diagnosis not present

## 2021-08-02 NOTE — Progress Notes (Signed)
Office Visit Note   Patient: Haley Reyes           Date of Birth: 1975-11-30           MRN: [id number] Visit Date: 08/02/2021              Requested by: Orion Crook I, NP [address] [place],  [place] [address] PCP: Orion Crook I, NP   Assessment & Plan: Visit Diagnoses:  1. Numbness and tingling in right hand     Plan: We discussed the diagnosis, prognosis, non-operative and operative treatment options for carpal tunnel syndrome.  At this point, we will try a night brace given her nocturnal symptoms.  We will also schedule an EMG/NCS to further evaluate her symptoms.   All patient questions and concerns were addressed. I will see her back after the study is completed.    Follow-Up Instructions: No follow-ups on file.   Orders:  Orders Placed This Encounter  Procedures   Ambulatory referral to Physical Medicine Rehab   No orders of the defined types were placed in this encounter.     Procedures: No procedures performed   Clinical Data: No additional findings.   Subjective: Chief Complaint  Patient presents with   Right Hand - Numbness, Edema, Weakness    Is a 45 year old right-hand-dominant female who presents with numbness and tingling of her right hand for at least 2 and half years.  She notes that it is mostly her thumb index and middle finger but occasionally her ring and small finger are involved.  Her fingers go numb with certain activities.  They are not numb currently.  She has nocturnal symptoms 3 times a week in which she wakes up with pain and numbness in her fingers and has to wake up and walk around for symptom relief.  She notes that she has been losing some strength in this hand has been dropping things occasionally.  She initially thought that her symptoms were related to her previous fibromyalgia diagnosis.  She has a questionable history of self-described neuropathy with some numbness and tingling in both of her feet.  She has no  history of diabetes, hypothyroid disease, wrist trauma, inflammatory arthropathy, or C-spine issues.  She is never had any work-up for this.  She is never any treatment.  Weakness Associated symptoms include weakness.   Review of Systems  Neurological:  Positive for weakness.    Objective: Vital Signs: BP 109/71 (BP Location: Left Arm, Patient Position: Sitting)   Pulse 65   Ht 5' (1.524 m)   Wt 129 lb (58.5 kg)   BMI 25.19 kg/m   Physical Exam Constitutional:      Appearance: Normal appearance.  Cardiovascular:     Rate and Rhythm: Normal rate.     Pulses: Normal pulses.  Pulmonary:     Effort: Pulmonary effort is normal.  Skin:    General: Skin is warm and dry.     Capillary Refill: Capillary refill takes less than 2 seconds.  Neurological:     Mental Status: She is alert.    Right Hand Exam   Tenderness  The patient is experiencing no tenderness.   Other  Erythema: absent Sensation: normal Pulse: present  Comments:  Full and painless ROM of fingers.  + Tinel at wrist.  + CT compression with symptoms into index and middle fingers.  + Phalen test.  Equivocal Tinel at elbow.  SILT in all fingers and symmetric to contralateral side.  4/5 Thenar motor strength without obvious atrophy.      Specialty Comments:  No specialty comments available.  Imaging: No results found.   PMFS History: Patient Active Problem List   Diagnosis Date Noted   Chronic bilateral low back pain without sciatica 02/24/2020   Neuropathy 08/01/2017   Fatigue 08/01/2017   Fibromyalgia 07/23/2013   Osteoarthritis of both knees 07/23/2013   Rotator cuff tendonitis 07/23/2013   Insomnia, psychophysiological 01/06/2013   Inadequate sleep hygiene 01/06/2013   Past Medical History:  Diagnosis Date   Angina at rest [facility])    Anxiety    Arthritis    Fibromyalgia    Insomnia    Neuropathy    PTSD (post-traumatic stress disorder)     Family History  Problem Relation Age of Onset    Lung cancer Father    Hypertension Father    COPD Maternal Grandmother    Congestive Heart Failure Maternal Grandmother    Allergies Other        children x 4   Asthma Other        children x 4   Diabetes Other        fathers siblings   Allergies Brother    Congestive Heart Failure Other        aunt   Sleep apnea Brother    Hypertension Mother     Past Surgical History:  Procedure Laterality Date   CHOLECYSTECTOMY  2000   cystic fibroids     TUBAL LIGATION     Social History   Occupational History   Occupation: unemployed    Associate Professor: NOT EMPLOYED  Tobacco Use   Smoking status: Former    Packs/day: 0.50    Types: Cigarettes    Start date: 09/04/1998    Quit date: 05/06/2021    Years since quitting: 0.2   Smokeless tobacco: Former  Building services engineer Use: Never used  Substance and Sexual Activity   Alcohol use: Yes    Comment: glass of red wine occassionally during the week. None at the time d/t new medications.    Drug use: No   Sexual activity: Not Currently

## 2021-08-03 ENCOUNTER — Telehealth: Payer: Self-pay | Admitting: Allergy and Immunology

## 2021-08-03 NOTE — Telephone Encounter (Signed)
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

## 2021-08-03 NOTE — Telephone Encounter (Signed)
Patient called and would like for a nurse to call her about her allergy test and explain the results to her about wheat . [phone]

## 2021-08-10 ENCOUNTER — Ambulatory Visit (INDEPENDENT_AMBULATORY_CARE_PROVIDER_SITE_OTHER): Payer: Medicaid Other | Admitting: Physical Medicine and Rehabilitation

## 2021-08-10 ENCOUNTER — Encounter: Payer: Self-pay | Admitting: Physical Medicine and Rehabilitation

## 2021-08-10 ENCOUNTER — Other Ambulatory Visit: Payer: Self-pay

## 2021-08-10 DIAGNOSIS — R202 Paresthesia of skin: Secondary | ICD-10-CM

## 2021-08-10 NOTE — Telephone Encounter (Signed)
Pt informed and stated understanding to talking further with return visit

## 2021-08-10 NOTE — Progress Notes (Signed)
Pt state right hand pain that travels to all finger. Pt state she can't lay down on either side due to the pain. Pt state it feels like pin and needles. Pt state she takes over the counter pain meds. Pt state she is right handed.  Numeric Pain Rating Scale and Functional Assessment Average Pain 7   In the last MONTH (on 0-10 scale) has pain interfered with the following?  1. General activity like being  able to carry out your everyday physical activities such as walking, climbing stairs, carrying groceries, or moving a chair?  Rating(10)   -BT, -Dye Allergies.

## 2021-08-12 ENCOUNTER — Telehealth: Payer: Self-pay | Admitting: Physical Medicine and Rehabilitation

## 2021-08-12 NOTE — Telephone Encounter (Signed)
Pt called requesting a call back. Pt is waiting for nerve study results and need a call back to set appt to go over results. Please call pt at [phone].

## 2021-08-15 ENCOUNTER — Encounter: Payer: Self-pay | Admitting: Physical Medicine and Rehabilitation

## 2021-08-15 ENCOUNTER — Ambulatory Visit: Payer: Self-pay | Admitting: Nurse Practitioner

## 2021-08-15 NOTE — Progress Notes (Signed)
Haley Reyes - 45 y.o. female MRN [id number]  Date of birth: 24-Jul-1976  Office Visit Note: Visit Date: 08/10/2021 PCP: Orion Crook I, NP Referred by: Kathrynn Speed, NP  Subjective: Chief Complaint  Patient presents with   Right Hand - Numbness, Pain   HPI:  Haley Reyes is a 45 y.o. female who comes in today at the request of Dr. Waylan Rocher for electrodiagnostic study of the Right upper extremities.  Patient is Right hand dominant.  She reports a several year history of chronic worsening severe right hand pain with paresthesia and what she refers to as pins-and-needles.  She reports that she really cannot lay on either side trying to sleep because of the symptoms in the hands.  She endorses that this travels really to all of her fingers on the right hand in a nondermatomal fashion.  She reports some type of history of neuropathy although she is really unsure how that diagnosis was made.  I do not see any prior electrodiagnostic studies.  She does have a history of fibromyalgia which is treated by her primary care provider Orion Crook, NP with gabapentin and Flexeril and sleep management.  She has also been referred to [facility] pain management in November.  She denies any frank radicular type symptoms.  She does get some tingling in the feet at times.  ROS Otherwise per HPI.  Assessment & Plan: Visit Diagnoses:    ICD-10-CM   1. Paresthesia of skin  R20.2 NCV with EMG (electromyography)      Plan: Impression: The above electrodiagnostic study is essentially NORMAL focal nerve entrapment, brachial plexopathy or cervical radiculopathy.  The noted slowing of the sensory nerve action potentials of the median and ulnar nerves is temperature artifact.  Despite using in room heater patient's skin temperature stayed at 28.3 C.  The temperature artifact can be noted with the increased amplitude and widened curve.   **This electrodiagnostic study cannot rule out small fiber  polyneuropathy and dysesthesias from central pain syndromes such as stroke or central pain sensitization syndromes such as fibromyalgia.  Myotomal referral pain from trigger points is also not excluded.  Recommendations: 1.  Follow-up with referring physician. 2.  Continue current management of symptoms.  Consider neurology consultation.  Meds & Orders: No orders of the defined types were placed in this encounter.   Orders Placed This Encounter  Procedures   NCV with EMG (electromyography)    Follow-up: Return in about 2 weeks (around 08/24/2021) for Waylan Rocher, MD.   Procedures: No procedures performed  EMG & NCV Findings: Evaluation of the right median (across palm) sensory nerve showed prolonged distal peak latency (Wrist, 4.1 ms) and prolonged distal peak latency (Palm, 2.2 ms).  The right ulnar sensory nerve showed prolonged distal peak latency (4.1 ms) and decreased conduction velocity (Wrist-5th Digit, 34 m/s).  All remaining nerves (as indicated in the following tables) were within normal limits.    All examined muscles (as indicated in the following table) showed no evidence of electrical instability.    Impression: The above electrodiagnostic study is essentially NORMAL focal nerve entrapment, brachial plexopathy or cervical radiculopathy.  The noted slowing of the sensory nerve action potentials of the median and ulnar nerves is temperature artifact.  Despite using in room heater patient's skin temperature stayed at 28.3 C.  The temperature artifact can be noted with the increased amplitude and widened curve.   **This electrodiagnostic study cannot rule out small fiber polyneuropathy and dysesthesias from  central pain syndromes such as stroke or central pain sensitization syndromes such as fibromyalgia.  Myotomal referral pain from trigger points is also not excluded.  Recommendations: 1.  Follow-up with referring physician. 2.  Continue current management of symptoms.   Consider neurology consultation.  ___________________________ Naaman Plummer FAAPMR Board Certified, American Board of Physical Medicine and Rehabilitation    Nerve Conduction Studies Anti Sensory Summary Table   Stim Site NR Peak (ms) Norm Peak (ms) P-T Amp (V) Norm P-T Amp Site1 Site2 Delta-P (ms) Dist (cm) Vel (m/s) Norm Vel (m/s)  Right Median Acr Palm Anti Sensory (2nd Digit)  28.3C  Wrist    *4.1 <3.6 63.8 >10 Wrist Palm 1.9 0.0    Palm    *2.2 <2.0 30.9         Right Radial Anti Sensory (Base 1st Digit)  28.7C  Wrist    2.3 <3.1 52.0  Wrist Base 1st Digit 2.3 0.0    Right Ulnar Anti Sensory (5th Digit)  28.6C  Wrist    *4.1 <3.7 48.1 >15.0 Wrist 5th Digit 4.1 14.0 *34 >38   Motor Summary Table   Stim Site NR Onset (ms) Norm Onset (ms) O-P Amp (mV) Norm O-P Amp Site1 Site2 Delta-0 (ms) Dist (cm) Vel (m/s) Norm Vel (m/s)  Right Median Motor (Abd Poll Brev)  28.8C  Wrist    4.1 <4.2 12.6 >5 Elbow Wrist 4.1 20.5 50 >50  Elbow    8.2  12.7         Right Ulnar Motor (Abd Dig Min)  29C  Wrist    3.4 <4.2 8.3 >3 B Elbow Wrist 3.4 20.0 59 >53  B Elbow    6.8  7.7  A Elbow B Elbow 1.4 10.0 71 >53  A Elbow    8.2  8.1          EMG   Side Muscle Nerve Root Ins Act Fibs Psw Amp Dur Poly Recrt Int Dennie Bible Comment  Right Abd Poll Brev Median C8-T1 Nml Nml Nml Nml Nml 0 Nml Nml   Right 1stDorInt Ulnar C8-T1 Nml Nml Nml Nml Nml 0 Nml Nml   Right PronatorTeres Median C6-7 Nml Nml Nml Nml Nml 0 Nml Nml   Right Biceps Musculocut C5-6 Nml Nml Nml Nml Nml 0 Nml Nml   Right Deltoid Axillary C5-6 Nml Nml Nml Nml Nml 0 Nml Nml     Nerve Conduction Studies Anti Sensory Left/Right Comparison   Stim Site L Lat (ms) R Lat (ms) L-R Lat (ms) L Amp (V) R Amp (V) L-R Amp (%) Site1 Site2 L Vel (m/s) R Vel (m/s) L-R Vel (m/s)  Median Acr Palm Anti Sensory (2nd Digit)  28.3C  Wrist  *4.1   63.8  Wrist Palm     Palm  *2.2   30.9        Radial Anti Sensory (Base 1st Digit)  28.7C  Wrist  2.3    52.0  Wrist Base 1st Digit     Ulnar Anti Sensory (5th Digit)  28.6C  Wrist  *4.1   48.1  Wrist 5th Digit  *34    Motor Left/Right Comparison   Stim Site L Lat (ms) R Lat (ms) L-R Lat (ms) L Amp (mV) R Amp (mV) L-R Amp (%) Site1 Site2 L Vel (m/s) R Vel (m/s) L-R Vel (m/s)  Median Motor (Abd Poll Brev)  28.8C  Wrist  4.1   12.6  Elbow Wrist  50   Elbow  8.2  12.7        Ulnar Motor (Abd Dig Min)  29C  Wrist  3.4   8.3  B Elbow Wrist  59   B Elbow  6.8   7.7  A Elbow B Elbow  71   A Elbow  8.2   8.1           Waveforms:            Clinical History: No specialty comments available.     Objective:  VS:  HT:    WT:   BMI:     BP:   HR: bpm  TEMP: ( )  RESP:  Physical Exam Musculoskeletal:        General: No swelling, tenderness or deformity.     Comments: Inspection reveals no atrophy of the bilateral APB or FDI or hand intrinsics. There is no swelling, color changes, allodynia or dystrophic changes. There is 5 out of 5 strength in the bilateral wrist extension, finger abduction and long finger flexion. There is intact sensation to light touch in all dermatomal and peripheral nerve distributions.There is a negative Tinel's test at the bilateral wrist and elbow. There is a negative Phalen's test bilaterally. There is a negative Hoffmann's test bilaterally.  Skin:    General: Skin is warm and dry.     Findings: No erythema or rash.  Neurological:     General: No focal deficit present.     Mental Status: She is alert and oriented to person, place, and time.     Motor: No weakness or abnormal muscle tone.     Coordination: Coordination normal.  Psychiatric:        Mood and Affect: Mood normal.        Behavior: Behavior normal.     Imaging: No results found.

## 2021-08-15 NOTE — Procedures (Signed)
EMG & NCV Findings: Evaluation of the right median (across palm) sensory nerve showed prolonged distal peak latency (Wrist, 4.1 ms) and prolonged distal peak latency (Palm, 2.2 ms).  The right ulnar sensory nerve showed prolonged distal peak latency (4.1 ms) and decreased conduction velocity (Wrist-5th Digit, 34 m/s).  All remaining nerves (as indicated in the following tables) were within normal limits.    All examined muscles (as indicated in the following table) showed no evidence of electrical instability.    Impression: The above electrodiagnostic study is essentially NORMAL focal nerve entrapment, brachial plexopathy or cervical radiculopathy.  The noted slowing of the sensory nerve action potentials of the median and ulnar nerves is temperature artifact.  Despite using in room heater patient's skin temperature stayed at 28.3 C.  The temperature artifact can be noted with the increased amplitude and widened curve.   **This electrodiagnostic study cannot rule out small fiber polyneuropathy and dysesthesias from central pain syndromes such as stroke or central pain sensitization syndromes such as fibromyalgia.  Myotomal referral pain from trigger points is also not excluded.  Recommendations: 1.  Follow-up with referring physician. 2.  Continue current management of symptoms.  Consider neurology consultation.  ___________________________ Naaman Plummer FAAPMR Board Certified, American Board of Physical Medicine and Rehabilitation    Nerve Conduction Studies Anti Sensory Summary Table   Stim Site NR Peak (ms) Norm Peak (ms) P-T Amp (V) Norm P-T Amp Site1 Site2 Delta-P (ms) Dist (cm) Vel (m/s) Norm Vel (m/s)  Right Median Acr Palm Anti Sensory (2nd Digit)  28.3C  Wrist    *4.1 <3.6 63.8 >10 Wrist Palm 1.9 0.0    Palm    *2.2 <2.0 30.9         Right Radial Anti Sensory (Base 1st Digit)  28.7C  Wrist    2.3 <3.1 52.0  Wrist Base 1st Digit 2.3 0.0    Right Ulnar Anti Sensory (5th Digit)   28.6C  Wrist    *4.1 <3.7 48.1 >15.0 Wrist 5th Digit 4.1 14.0 *34 >38   Motor Summary Table   Stim Site NR Onset (ms) Norm Onset (ms) O-P Amp (mV) Norm O-P Amp Site1 Site2 Delta-0 (ms) Dist (cm) Vel (m/s) Norm Vel (m/s)  Right Median Motor (Abd Poll Brev)  28.8C  Wrist    4.1 <4.2 12.6 >5 Elbow Wrist 4.1 20.5 50 >50  Elbow    8.2  12.7         Right Ulnar Motor (Abd Dig Min)  29C  Wrist    3.4 <4.2 8.3 >3 B Elbow Wrist 3.4 20.0 59 >53  B Elbow    6.8  7.7  A Elbow B Elbow 1.4 10.0 71 >53  A Elbow    8.2  8.1          EMG   Side Muscle Nerve Root Ins Act Fibs Psw Amp Dur Poly Recrt Int Dennie Bible Comment  Right Abd Poll Brev Median C8-T1 Nml Nml Nml Nml Nml 0 Nml Nml   Right 1stDorInt Ulnar C8-T1 Nml Nml Nml Nml Nml 0 Nml Nml   Right PronatorTeres Median C6-7 Nml Nml Nml Nml Nml 0 Nml Nml   Right Biceps Musculocut C5-6 Nml Nml Nml Nml Nml 0 Nml Nml   Right Deltoid Axillary C5-6 Nml Nml Nml Nml Nml 0 Nml Nml     Nerve Conduction Studies Anti Sensory Left/Right Comparison   Stim Site L Lat (ms) R Lat (ms) L-R Lat (ms) L Amp (V) R Amp (  V) L-R Amp (%) Site1 Site2 L Vel (m/s) R Vel (m/s) L-R Vel (m/s)  Median Acr Palm Anti Sensory (2nd Digit)  28.3C  Wrist  *4.1   63.8  Wrist Palm     Palm  *2.2   30.9        Radial Anti Sensory (Base 1st Digit)  28.7C  Wrist  2.3   52.0  Wrist Base 1st Digit     Ulnar Anti Sensory (5th Digit)  28.6C  Wrist  *4.1   48.1  Wrist 5th Digit  *34    Motor Left/Right Comparison   Stim Site L Lat (ms) R Lat (ms) L-R Lat (ms) L Amp (mV) R Amp (mV) L-R Amp (%) Site1 Site2 L Vel (m/s) R Vel (m/s) L-R Vel (m/s)  Median Motor (Abd Poll Brev)  28.8C  Wrist  4.1   12.6  Elbow Wrist  50   Elbow  8.2   12.7        Ulnar Motor (Abd Dig Min)  29C  Wrist  3.4   8.3  B Elbow Wrist  59   B Elbow  6.8   7.7  A Elbow B Elbow  71   A Elbow  8.2   8.1           Waveforms:

## 2021-08-16 ENCOUNTER — Telehealth: Payer: Self-pay

## 2021-08-16 NOTE — Telephone Encounter (Signed)
Pt called in and had turned in a [employer] form for Disability and was wondering if it had been filled out

## 2021-08-17 ENCOUNTER — Ambulatory Visit: Payer: Self-pay | Admitting: Nurse Practitioner

## 2021-08-22 ENCOUNTER — Telehealth: Payer: Self-pay | Admitting: Orthopedic Surgery

## 2021-08-22 NOTE — Telephone Encounter (Signed)
Come in for appt

## 2021-08-22 NOTE — Telephone Encounter (Signed)
Pt called and wondering the results from her NCS?   CB [phone]

## 2021-08-23 NOTE — Telephone Encounter (Signed)
Would like a call with results cannot come in office "has a lot going on"  Please call if possible.

## 2021-08-23 NOTE — Telephone Encounter (Signed)
Just realized that this is a [facility] patient.  Thanks.

## 2021-08-24 NOTE — Telephone Encounter (Signed)
Patient would like to know what the results of her NCS

## 2021-08-25 ENCOUNTER — Ambulatory Visit: Payer: Medicaid Other | Admitting: Orthopedic Surgery

## 2021-08-25 ENCOUNTER — Telehealth: Payer: Self-pay

## 2021-08-25 NOTE — Telephone Encounter (Signed)
Pt walked in office in regards to form and asked if you can call her!  About her second diagnosis SHE DID NOT WANT TO speak with a NURSE!

## 2021-09-06 ENCOUNTER — Ambulatory Visit: Payer: Medicaid Other | Admitting: Allergy and Immunology

## 2021-09-26 ENCOUNTER — Telehealth (HOSPITAL_COMMUNITY): Payer: Medicaid Other | Admitting: Psychiatry

## 2021-09-26 ENCOUNTER — Other Ambulatory Visit: Payer: Self-pay

## 2021-09-29 DIAGNOSIS — F4310 Post-traumatic stress disorder, unspecified: Secondary | ICD-10-CM | POA: Diagnosis not present

## 2021-10-14 ENCOUNTER — Ambulatory Visit (INDEPENDENT_AMBULATORY_CARE_PROVIDER_SITE_OTHER): Payer: Medicaid Other | Admitting: Nurse Practitioner

## 2021-10-14 ENCOUNTER — Encounter: Payer: Self-pay | Admitting: Nurse Practitioner

## 2021-10-14 ENCOUNTER — Other Ambulatory Visit: Payer: Self-pay

## 2021-10-14 VITALS — BP 113/75 | HR 96 | Temp 98.6°F | Ht 60.0 in | Wt 131.1 lb

## 2021-10-14 DIAGNOSIS — F419 Anxiety disorder, unspecified: Secondary | ICD-10-CM | POA: Diagnosis not present

## 2021-10-14 DIAGNOSIS — F3289 Other specified depressive episodes: Secondary | ICD-10-CM

## 2021-10-14 DIAGNOSIS — F331 Major depressive disorder, recurrent, moderate: Secondary | ICD-10-CM

## 2021-10-14 DIAGNOSIS — F4310 Post-traumatic stress disorder, unspecified: Secondary | ICD-10-CM | POA: Diagnosis not present

## 2021-10-14 MED ORDER — ESCITALOPRAM OXALATE 20 MG PO TABS: 20.0000 mg | ORAL_TABLET | Freq: Every day | ORAL | 5 refills | Status: DC

## 2021-10-14 MED ORDER — GABAPENTIN 400 MG PO CAPS: 400.0000 mg | ORAL_CAPSULE | Freq: Every day | ORAL | 5 refills | Status: DC

## 2021-10-14 NOTE — Progress Notes (Signed)
[address], [place]  [address] Phone:  [phone]   Fax:  [phone] Subjective:   Patient ID: Haley Reyes, female    DOB: December 08, 1975, 46 y.o.   MRN: [id number]  Chief Complaint  Patient presents with   Follow-up    Pt is here for follow up visit. Pt stated she would like to talk about her mental health   HPI Haley Reyes 46 y.o. female  has a past medical history of Angina at rest [facility]), Anxiety, Arthritis, Fibromyalgia, Insomnia, Neuropathy, and PTSD (post-traumatic stress disorder). To the [facility] for medication refill, depression and anxiety.  Requesting refill of gabapentin and Lexapro, was being managed by psychiatry but stopped attending appointment last year due to poor relationship with psychiatrist. Last dose of either medication was several months ago. Has found a new therapist, next appointment 10/17/21. Denies any SI/HI. Endorses having increased periods of mixed anxiety and/ depression. Suffers regularly with PTSD and feelings of paranoia.   Denies any other complaints today. Denies any fever. Denies any fatigue, chest pain, shortness of breath, HA or dizziness. Denies any blurred vision, numbness or tingling.  Past Medical History:  Diagnosis Date   Angina at rest [facility])    Anxiety    Arthritis    Fibromyalgia    Insomnia    Neuropathy    PTSD (post-traumatic stress disorder)     Past Surgical History:  Procedure Laterality Date   CHOLECYSTECTOMY  2000   cystic fibroids     TUBAL LIGATION      Family History  Problem Relation Age of Onset   Lung cancer Father    Hypertension Father    COPD Maternal Grandmother    Congestive Heart Failure Maternal Grandmother    Allergies Other        children x 4   Asthma Other        children x 4   Diabetes Other        fathers siblings   Allergies Brother    Congestive Heart Failure Other        aunt   Sleep apnea Brother    Hypertension Mother     Social  History   Socioeconomic History   Marital status: Single    Spouse name: Not on file   Number of children: 4   Years of education: Not on file   Highest education level: Not on file  Occupational History   Occupation: unemployed    Employer: NOT EMPLOYED  Tobacco Use   Smoking status: Former    Packs/day: 0.50    Types: Cigarettes    Start date: 09/04/1998    Quit date: 05/06/2021    Years since quitting: 0.4   Smokeless tobacco: Former  Scientific laboratory technician Use: Never used  Substance and Sexual Activity   Alcohol use: Yes    Comment: glass of red wine occassionally during the week. None at the time d/t new medications.    Drug use: No   Sexual activity: Not Currently  Other Topics Concern   Not on file  Social History Narrative   Not on file   Social Determinants of Health   Financial Resource Strain: Not on file  Food Insecurity: Not on file  Transportation Needs: Not on file  Physical Activity: Not on file  Stress: Not on file  Social Connections: Not on file  Intimate Partner Violence: Not on file    Outpatient Medications  Prior to Visit  Medication Sig Dispense Refill   cyclobenzaprine (FLEXERIL) 10 MG tablet Take 1 tablet (10 mg total) by mouth 3 (three) times daily as needed for muscle spasms. 30 tablet 0   cyproheptadine (PERIACTIN) 4 MG tablet Take 1 tablet (4 mg total) by mouth at bedtime. Take a half a tablet or a whole tablet. 30 tablet 5   EPINEPHrine 0.3 mg/0.3 mL IJ SOAJ injection Inject 0.3 mLs (0.3 mg total) into the muscle as needed for anaphylaxis. 1 each 1   loratadine (CLARITIN) 10 MG tablet Take 1 tablet (10 mg total) by mouth 2 (two) times daily as needed for allergies (Can use an extra dose during flare ups). 60 tablet 5   Olopatadine HCl (PATADAY) 0.2 % SOLN Place 1 drop into both eyes 1 day or 1 dose. 2.5 mL 5   RESTASIS 0.05 % ophthalmic emulsion 1 drop 2 (two) times daily.     traZODone (DESYREL) 50 MG tablet Take 1 tablet (50 mg total) by  mouth at bedtime as needed for sleep. 30 tablet 2   triamcinolone (NASACORT) 55 MCG/ACT AERO nasal inhaler Place 2 sprays into the nose daily. 16.5 g 5   escitalopram (LEXAPRO) 20 MG tablet Take 1 tablet (20 mg total) by mouth daily. 30 tablet 2   gabapentin (NEURONTIN) 400 MG capsule Take 1 capsule (400 mg total) by mouth at bedtime. 30 capsule 2   No facility-administered medications prior to visit.    Allergies  Allergen Reactions   Shrimp [Shellfish Allergy]    Wheat Bran    Benadryl [Diphenhydramine Hcl]     unknown   Codeine Other (See Comments)    Sweating real bad felt like she was going to pass out    Watermelon Flavor Itching    SOB   Hydrocodone-Ibuprofen Palpitations   Latex Itching and Rash   Other Nausea And Vomiting and Anxiety    *Caffine*    Review of Systems  Constitutional:  Negative for chills, fever and malaise/fatigue.  Respiratory:  Negative for cough and shortness of breath.   Cardiovascular:  Negative for chest pain, palpitations and leg swelling.  Gastrointestinal:  Negative for abdominal pain, blood in stool, constipation, diarrhea, nausea and vomiting.  Skin: Negative.   Neurological: Negative.   Psychiatric/Behavioral:  Positive for depression. Negative for suicidal ideas. The patient is nervous/anxious.   All other systems reviewed and are negative.     Objective:    Physical Exam Vitals reviewed.  Constitutional:      General: She is not in acute distress.    Appearance: Normal appearance. She is normal weight.  HENT:     Head: Normocephalic.  Cardiovascular:     Rate and Rhythm: Normal rate and regular rhythm.     Pulses: Normal pulses.     Heart sounds: Normal heart sounds.     Comments: No obvious peripheral edema Pulmonary:     Effort: Pulmonary effort is normal.     Breath sounds: Normal breath sounds.  Skin:    General: Skin is warm and dry.     Capillary Refill: Capillary refill takes less than 2 seconds.  Neurological:      General: No focal deficit present.     Mental Status: She is alert and oriented to person, place, and time.  Psychiatric:        Mood and Affect: Mood normal.        Behavior: Behavior normal.        Thought  Content: Thought content normal.        Judgment: Judgment normal.    BP 113/75    Pulse 96    Temp 98.6 F (37 C)    Ht 5' (1.524 m)    Wt 131 lb 2 oz (59.5 kg)    SpO2 100%    BMI 25.61 kg/m  Wt Readings from Last 3 Encounters:  10/14/21 131 lb 2 oz (59.5 kg)  08/02/21 129 lb (58.5 kg)  07/14/21 129 lb 0.2 oz (58.5 kg)    There is no immunization history for the selected administration types on file for this patient.  Diabetic Foot Exam - Simple   No data filed     Lab Results  Component Value Date   TSH 0.758 02/24/2020   Lab Results  Component Value Date   WBC 4.7 05/13/2021   HGB 12.5 05/13/2021   HCT 37.5 05/13/2021   MCV 92 05/13/2021   PLT 277 05/13/2021   Lab Results  Component Value Date   NA 139 05/13/2021   K 4.2 05/13/2021   CO2 24 05/13/2021   GLUCOSE 84 05/13/2021   BUN 9 05/13/2021   CREATININE 0.74 05/13/2021   BILITOT 0.2 05/13/2021   ALKPHOS 41 (L) 05/13/2021   AST 14 05/13/2021   ALT 13 05/13/2021   PROT 7.0 05/13/2021   ALBUMIN 4.4 05/13/2021   CALCIUM 9.5 05/13/2021   ANIONGAP 7 07/20/2015   EGFR 103 05/13/2021   Lab Results  Component Value Date   CHOL 144 05/13/2021   CHOL 141 02/24/2020   CHOL 150 04/05/2018   Lab Results  Component Value Date   HDL 60 05/13/2021   HDL 55 02/24/2020   HDL 55 04/05/2018   Lab Results  Component Value Date   LDLCALC 69 05/13/2021   LDLCALC 72 02/24/2020   LDLCALC 79 04/05/2018   Lab Results  Component Value Date   TRIG 77 05/13/2021   TRIG 71 02/24/2020   TRIG 82 04/05/2018   Lab Results  Component Value Date   CHOLHDL 2.4 05/13/2021   CHOLHDL 2.6 02/24/2020   CHOLHDL 2.7 04/05/2018   Lab Results  Component Value Date   HGBA1C 5.2 05/13/2021   HGBA1C 5.1 04/05/2018    HGBA1C 5.3 08/01/2017       Assessment & Plan:   Problem List Items Addressed This Visit   None Visit Diagnoses     Anxiety    -  Primary   Relevant Medications   escitalopram (LEXAPRO) 20 MG tablet, medication refilled  Discussed non pharmacological methods for management Encouraged to maintain appointment with counselor    Other depression       Relevant Medications   escitalopram (LEXAPRO) 20 MG tablet Discussed non pharmacological methods for management Encouraged to maintain appointment with counselor    MDD (major depressive disorder), recurrent episode, moderate (HCC)       Relevant Medications   escitalopram (LEXAPRO) 20 MG tablet   PTSD (post-traumatic stress disorder)       Relevant Medications   escitalopram (LEXAPRO) 20 MG tablet   gabapentin (NEURONTIN) 400 MG capsule   Follow up in 1 wk for pap smear, sooner as needed    I am having Izella B. Mcglory maintain her cyclobenzaprine, EPINEPHrine, Restasis, cyproheptadine, triamcinolone, loratadine, Olopatadine HCl, traZODone, escitalopram, and gabapentin.  Meds ordered this encounter  Medications   escitalopram (LEXAPRO) 20 MG tablet    Sig: Take 1 tablet (20 mg total) by mouth daily.  Dispense:  30 tablet    Refill:  5   gabapentin (NEURONTIN) 400 MG capsule    Sig: Take 1 capsule (400 mg total) by mouth at bedtime.    Dispense:  30 capsule    Refill:  5     Teena Dunk, NP

## 2021-10-14 NOTE — Patient Instructions (Signed)
You were seen today in the [facility] for anxiety/ depression and medication refill.  You were prescribed medications, please take as directed. Please follow up in 1 wk for pap smear.

## 2021-10-21 ENCOUNTER — Ambulatory Visit: Payer: Self-pay | Admitting: Nurse Practitioner

## 2021-11-07 DIAGNOSIS — F4310 Post-traumatic stress disorder, unspecified: Secondary | ICD-10-CM | POA: Diagnosis not present

## 2021-11-11 ENCOUNTER — Telehealth: Payer: Self-pay

## 2021-11-11 NOTE — Telephone Encounter (Signed)
Pt called and ask for another handicap plaque that her is going to expire ?

## 2021-11-15 ENCOUNTER — Ambulatory Visit (INDEPENDENT_AMBULATORY_CARE_PROVIDER_SITE_OTHER): Payer: Medicaid Other | Admitting: Allergy and Immunology

## 2021-11-15 ENCOUNTER — Other Ambulatory Visit: Payer: Self-pay

## 2021-11-15 VITALS — BP 96/68 | HR 99 | Temp 97.6°F | Resp 16 | Ht 61.0 in | Wt 130.0 lb

## 2021-11-15 DIAGNOSIS — Z889 Allergy status to unspecified drugs, medicaments and biological substances status: Secondary | ICD-10-CM | POA: Diagnosis not present

## 2021-11-15 DIAGNOSIS — L500 Allergic urticaria: Secondary | ICD-10-CM | POA: Diagnosis not present

## 2021-11-15 DIAGNOSIS — T781XXD Other adverse food reactions, not elsewhere classified, subsequent encounter: Secondary | ICD-10-CM

## 2021-11-15 DIAGNOSIS — H1013 Acute atopic conjunctivitis, bilateral: Secondary | ICD-10-CM

## 2021-11-15 DIAGNOSIS — J3089 Other allergic rhinitis: Secondary | ICD-10-CM

## 2021-11-15 DIAGNOSIS — G4720 Circadian rhythm sleep disorder, unspecified type: Secondary | ICD-10-CM

## 2021-11-15 DIAGNOSIS — J301 Allergic rhinitis due to pollen: Secondary | ICD-10-CM

## 2021-11-15 DIAGNOSIS — H1010 Acute atopic conjunctivitis, unspecified eye: Secondary | ICD-10-CM

## 2021-11-15 MED ORDER — EPINEPHRINE 0.3 MG/0.3ML IJ SOAJ
0.3000 mg | INTRAMUSCULAR | 1 refills | Status: DC | PRN
Start: 2021-11-15 — End: 2022-06-13

## 2021-11-15 MED ORDER — LORATADINE 10 MG PO TABS: 10.0000 mg | ORAL_TABLET | Freq: Two times a day (BID) | ORAL | 5 refills | Status: DC | PRN

## 2021-11-15 MED ORDER — TRIAMCINOLONE ACETONIDE 55 MCG/ACT NA AERO: 2.0000 | INHALATION_SPRAY | Freq: Every day | NASAL | 5 refills | Status: DC

## 2021-11-15 MED ORDER — OLOPATADINE HCL 0.2 % OP SOLN: 1.0000 [drp] | OPHTHALMIC | 5 refills | Status: DC

## 2021-11-15 MED ORDER — CYPROHEPTADINE HCL 4 MG PO TABS: 4.0000 mg | ORAL_TABLET | Freq: Every day | ORAL | 5 refills | Status: DC

## 2021-11-15 NOTE — Patient Instructions (Addendum)
?  1.  Allergen avoidance measures - dairy, wheat, banana, pollens, dust mite ? ?2.  Treat and prevent inflammation: ? ?A.  OTC Nasacort - 1-2 sprays each nostril 3-7 times per week ? ?3. Treat and prevent sleep dysfunction: ? ?A. Periactin 4 mg - 1/2 - 1 tablet at bedtime ? ?4. Use nicotine substitutes to eliminate smoke exposure ? ?5. If needed: ? ?A. Epi-pen, MD/ER evaluation for allergic reaction. ?B. Claritin / loratadine 10 mg - 1 tablet 1 time per day ?C. Pataday - 1 drop each eye 1 time per day ? ?6. Consider starting immunotherapy ? ?7. Return to clinic in Summer 2023 or earlier if problem ?

## 2021-11-15 NOTE — Progress Notes (Signed)
? ? - [facility] [place] [address] ? ? ?Follow-up Note ? ?Referring Provider: Kathrynn Speed, NP ?Primary Provider: Kathrynn Speed, NP ?Date of Office Visit: 11/15/2021 ? ?Subjective:  ? ?Haley Reyes (DOB: 09/10/1975) is a 46 y.o. female who [facility] in re-evaluation of the following: ? ?HPI: Chianna presents to this clinic in evaluation of allergic rhinoconjunctivitis, oral allergy syndrome, food allergy, sleep dysfunction, and tobacco smoking.  I last saw her in this clinic on 28 June 2021 for her initial evaluation. ? ?She has some issues with allergies with some nasal congestion and itchy red watery eyes especially during the spring even in the face of utilizing her therapy.  Fortunately, she has not required a systemic steroid or an antibiotic for any type of airway issue. ? ?She is doing better with her abdominal issue and her mouth issue while remaining away from specific food products especially dairy and wheat and banana and other fruits and vegetables.  She has not had to use an EpiPen. ? ?She does better with her sleep dysfunction when using 4 mg of Periactin at bedtime. ? ?She has not had any urticaria. ? ?She continues to smoke approximately 1 pack/day. ? ?Allergies as of 11/15/2021   ? ?   Reactions  ? Shrimp [shellfish Allergy]   ? Wheat Bran   ? Benadryl [diphenhydramine Hcl]   ? unknown  ? Codeine Other (See Comments)  ? Sweating real bad felt like she was going to pass out   ? Watermelon Flavor Itching  ? SOB  ? Hydrocodone-ibuprofen Palpitations  ? Latex Itching, Rash  ? Other Nausea And Vomiting, Anxiety  ? *Caffine*  ? ?  ? ?  ?Medication List  ? ? ?cyclobenzaprine 10 MG tablet ?Commonly known as: FLEXERIL ?Take 1 tablet (10 mg total) by mouth 3 (three) times daily as needed for muscle spasms. ?  ?cyproheptadine 4 MG tablet ?Commonly known as: PERIACTIN ?Take 1 tablet (4 mg total) by mouth at bedtime. Take a half  a tablet or a whole tablet. ?  ?EPINEPHrine 0.3 mg/0.3 mL Soaj injection ?Commonly known as: EPI-PEN ?Inject 0.3 mLs (0.3 mg total) into the muscle as needed for anaphylaxis. ?  ?escitalopram 20 MG tablet ?Commonly known as: Lexapro ?Take 1 tablet (20 mg total) by mouth daily. ?  ?gabapentin 400 MG capsule ?Commonly known as: NEURONTIN ?Take 1 capsule (400 mg total) by mouth at bedtime. ?  ?loratadine 10 MG tablet ?Commonly known as: Claritin ?Take 1 tablet (10 mg total) by mouth 2 (two) times daily as needed for allergies (Can use an extra dose during flare ups). ?  ?Olopatadine HCl 0.2 % Soln ?Commonly known as: Pataday ?Place 1 drop into both eyes 1 day or 1 dose. ?  ?Restasis 0.05 % ophthalmic emulsion ?Generic drug: cycloSPORINE ?1 drop 2 (two) times daily. ?  ?traZODone 50 MG tablet ?Commonly known as: DESYREL ?Take 1 tablet (50 mg total) by mouth at bedtime as needed for sleep. ?  ?triamcinolone 55 MCG/ACT Aero nasal inhaler ?Commonly known as: NASACORT ?Place 2 sprays into the nose daily. ?  ? ?Past Medical History:  ?Diagnosis Date  ? Angina at rest [facility])   ? Anxiety   ? Arthritis   ? Fibromyalgia   ? Insomnia   ? Neuropathy   ? PTSD (post-traumatic stress disorder)   ? ? ?Past Surgical History:  ?Procedure Laterality Date  ? CHOLECYSTECTOMY  2000  ?  cystic fibroids    ? TUBAL LIGATION    ? ? ?Review of systems negative except as noted in HPI / PMHx or noted below: ? ?Review of Systems  ?Constitutional: Negative.   ?HENT: Negative.    ?Eyes: Negative.   ?Respiratory: Negative.    ?Cardiovascular: Negative.   ?Gastrointestinal: Negative.   ?Genitourinary: Negative.   ?Musculoskeletal: Negative.   ?Skin: Negative.   ?Neurological: Negative.   ?Endo/Heme/Allergies: Negative.   ?Psychiatric/Behavioral: Negative.    ? ? ?Objective:  ? ?Vitals:  ? 11/15/21 0906  ?BP: 96/68  ?Pulse: 99  ?Resp: 16  ?Temp: 97.6 ?F (36.4 ?C)  ?SpO2: 100%  ? ?Height: 5\' 1"  (154.9 cm)  ?Weight: 130 lb (59 kg)  ? ?Physical  Exam ?Constitutional:   ?   Appearance: She is not diaphoretic.  ?HENT:  ?   Head: Normocephalic.  ?   Right Ear: Tympanic membrane, ear canal and external ear normal.  ?   Left Ear: Tympanic membrane, ear canal and external ear normal.  ?   Nose: Nose normal. No mucosal edema or rhinorrhea.  ?   Mouth/Throat:  ?   Pharynx: Uvula midline. No oropharyngeal exudate.  ?Eyes:  ?   Conjunctiva/sclera: Conjunctivae normal.  ?Neck:  ?   Thyroid: No thyromegaly.  ?   Trachea: Trachea normal. No tracheal tenderness or tracheal deviation.  ?Cardiovascular:  ?   Rate and Rhythm: Normal rate and regular rhythm.  ?   Heart sounds: Normal heart sounds, S1 normal and S2 normal. No murmur heard. ?Pulmonary:  ?   Effort: No respiratory distress.  ?   Breath sounds: Normal breath sounds. No stridor. No wheezing or rales.  ?Lymphadenopathy:  ?   Head:  ?   Right side of head: No tonsillar adenopathy.  ?   Left side of head: No tonsillar adenopathy.  ?   Cervical: No cervical adenopathy.  ?Skin: ?   Findings: No erythema or rash.  ?   Nails: There is no clubbing.  ?Neurological:  ?   Mental Status: She is alert.  ? ? ?Diagnostics: none ? ?Assessment and Plan:  ? ?1. Perennial allergic rhinitis   ?2. Multiple allergies   ?3. Seasonal allergic rhinitis due to pollen   ?4. Seasonal allergic conjunctivitis   ?5. Adverse food reaction, subsequent encounter   ?6. Pollen-food allergy, subsequent encounter   ?7. Allergic urticaria   ?8. Dysfunction of sleep stage or arousal   ? ? ?1.  Allergen avoidance measures - dairy, wheat, banana, pollens, dust mite ? ?2.  Treat and prevent inflammation: ? ?A.  OTC Nasacort - 1-2 sprays each nostril 3-7 times per week ? ?3. Treat and prevent sleep dysfunction: ? ?A. Periactin 4 mg - 1/2 - 1 tablet at bedtime ? ?4. Use nicotine substitutes to eliminate smoke exposure ? ?5. If needed: ? ?A. Epi-pen, MD/ER evaluation for allergic reaction. ?B. Claritin / loratadine 10 mg - 1 tablet 1 time per day ?C. Pataday  - 1 drop each eye 1 time per day ? ?6. Consider starting immunotherapy ? ?7. Return to clinic in Summer 2023 or earlier if problem ? ?Dajahnae has a very atopic immune system and I think in the long run the best way to address this issue with her allergic rhinoconjunctivitis and oral allergy syndrome is to start a course of immunotherapy and we have given her literature on this form of treatment during today's visit and she is presently considering this therapeutic option.  She  had a pretty good response regarding her sleep dysfunction while using Periactin.  I once again had a talk with her today about the need to replace her tobacco smoke exposure.  Assuming she does well with the plan noted above I will see her back in this clinic in the summer 2023 or earlier if there is a problem. ? ?Laurette SchimkeEric Keiden Deskin, MD ?Allergy / Immunology ?[facility] ?

## 2021-11-16 ENCOUNTER — Encounter: Payer: Self-pay | Admitting: Allergy and Immunology

## 2021-11-21 DIAGNOSIS — F4310 Post-traumatic stress disorder, unspecified: Secondary | ICD-10-CM | POA: Diagnosis not present

## 2021-12-05 DIAGNOSIS — F4310 Post-traumatic stress disorder, unspecified: Secondary | ICD-10-CM | POA: Diagnosis not present

## 2021-12-07 DIAGNOSIS — F4312 Post-traumatic stress disorder, chronic: Secondary | ICD-10-CM | POA: Diagnosis not present

## 2021-12-07 DIAGNOSIS — F331 Major depressive disorder, recurrent, moderate: Secondary | ICD-10-CM | POA: Diagnosis not present

## 2021-12-22 DIAGNOSIS — F4310 Post-traumatic stress disorder, unspecified: Secondary | ICD-10-CM | POA: Diagnosis not present

## 2021-12-23 ENCOUNTER — Telehealth: Payer: Self-pay

## 2022-01-02 DIAGNOSIS — F411 Generalized anxiety disorder: Secondary | ICD-10-CM | POA: Diagnosis not present

## 2022-01-03 ENCOUNTER — Telehealth: Payer: Self-pay

## 2022-01-03 MED ORDER — CROMOLYN SODIUM 4 % OP SOLN: 1.0000 [drp] | Freq: Four times a day (QID) | OPHTHALMIC | 5 refills | Status: AC | PRN

## 2022-01-03 NOTE — Addendum Note (Signed)
Addended by: Berna Bue on: 01/03/2022 02:07 PM ? ? Modules accepted: Orders ? ?

## 2022-01-03 NOTE — Telephone Encounter (Signed)
Please advise to change cromolyn? ?

## 2022-01-03 NOTE — Telephone Encounter (Signed)
Sent in rx of cromolyn  ?

## 2022-01-03 NOTE — Telephone Encounter (Signed)
Patient called stating she is unable to afford the Pataday @ $15 due to her fixed income. Patient is request something her insurance will cover or possibly Cromolyn Sodium 4%. ?[facility] [address]   ?

## 2022-01-11 ENCOUNTER — Ambulatory Visit (INDEPENDENT_AMBULATORY_CARE_PROVIDER_SITE_OTHER): Payer: Medicaid Other | Admitting: Nurse Practitioner

## 2022-01-11 DIAGNOSIS — J302 Other seasonal allergic rhinitis: Secondary | ICD-10-CM | POA: Diagnosis not present

## 2022-01-11 DIAGNOSIS — M797 Fibromyalgia: Secondary | ICD-10-CM

## 2022-01-11 DIAGNOSIS — M62838 Other muscle spasm: Secondary | ICD-10-CM | POA: Diagnosis not present

## 2022-01-11 MED ORDER — CYCLOBENZAPRINE HCL 10 MG PO TABS: 10.0000 mg | ORAL_TABLET | Freq: Three times a day (TID) | ORAL | 0 refills | Status: DC | PRN

## 2022-01-11 MED ORDER — CETIRIZINE HCL 10 MG PO TABS: 10.0000 mg | ORAL_TABLET | Freq: Every day | ORAL | 11 refills | Status: DC

## 2022-01-11 NOTE — Progress Notes (Signed)
Virtual Visit via Telephone Note ? ?I connected with Haley Reyes on 01/13/22 at 11:20 AM EDT by telephone and verified that I am speaking with the correct person using two identifiers. ?  ?I discussed the limitations, risks, security and privacy concerns of performing an evaluation and management service by telephone and the availability of in person appointments. I also discussed with the patient that there may be a patient responsible charge related to this service. The patient expressed understanding and agreed to proceed. ? ?Patient home ?Provider Office ? ?History of Present Illness: ? ?Haley Reyes  has a past medical history of Angina at rest [facility]), Anxiety, Arthritis, Fibromyalgia, Insomnia, Neuropathy, and PTSD (Reyes-traumatic stress disorder). Patients states that she has had worsening back,knee and headache pain for the past 2 mths, suspects that it is related to fibromyalgia. Requesting referral to pain management.  ? ?Patient also requesting medication for seasonal allergies and refill of cyclobenzaprine. States that she was on Claritin in the past, which seemed to be effective, but is willing to try Zyrtec.  ? ? Review of Systems  ?Constitutional:  Negative for chills, fever and malaise/fatigue.  ?HENT:  Negative for congestion, ear discharge, ear pain, hearing loss, nosebleeds, sinus pain, sore throat and tinnitus.   ?     See HPI  ?Respiratory:  Negative for cough, shortness of breath and stridor.   ?Cardiovascular:  Negative for chest pain, palpitations and leg swelling.  ?Gastrointestinal:  Negative for abdominal pain, blood in stool, constipation, diarrhea, nausea and vomiting.  ?Musculoskeletal:  Positive for back pain and joint pain. Negative for falls, myalgias and neck pain.  ?Skin: Negative.   ?Neurological:  Positive for headaches. Negative for dizziness, tingling, tremors, sensory change, speech change, focal weakness, seizures, loss of consciousness and weakness.   ?Psychiatric/Behavioral:  Negative for depression. The patient is not nervous/anxious.   ?All other systems reviewed and are negative.  ? ?Observations/Objective: ?No exam; telephone visit ? ?Assessment and Plan: ?1. Fibromyalgia ?- Ambulatory referral to Pain Clinic ? ?2. Seasonal allergies ?- cetirizine (ZYRTEC) 10 MG tablet; Take 1 tablet (10 mg total) by mouth daily.  Dispense: 30 tablet; Refill: 11 ? ?3. Muscle spasm ?- cyclobenzaprine (FLEXERIL) 10 MG tablet; Take 1 tablet (10 mg total) by mouth 3 (three) times daily as needed for muscle spasms.  Dispense: 30 tablet; Refill: 0 ? ? ? ?Follow Up Instructions: ? ?Maintain upcoming follow up with PCP, sooner as needed  ?  ?I discussed the assessment and treatment plan with the patient. The patient was provided an opportunity to ask questions and all were answered. The patient agreed with the plan and demonstrated an understanding of the instructions. ?  ?The patient was advised to call back or seek an in-person evaluation if the symptoms worsen or if the condition fails to improve as anticipated. ? ?I provided 20 minutes of telephone- visit time during this encounter. ? ? ?Kathrynn Speed, NP ?

## 2022-01-18 ENCOUNTER — Telehealth (HOSPITAL_COMMUNITY): Payer: Self-pay | Admitting: *Deleted

## 2022-01-18 NOTE — Telephone Encounter (Signed)
Pt called requesting a letter to excuse from jury duty. Ok to send to pt? ?

## 2022-01-19 DIAGNOSIS — F4310 Post-traumatic stress disorder, unspecified: Secondary | ICD-10-CM | POA: Diagnosis not present

## 2022-01-19 NOTE — Telephone Encounter (Signed)
Ok to send . Thanks. 

## 2022-01-24 ENCOUNTER — Encounter (HOSPITAL_COMMUNITY): Payer: Self-pay | Admitting: *Deleted

## 2022-02-01 DIAGNOSIS — F4312 Post-traumatic stress disorder, chronic: Secondary | ICD-10-CM | POA: Diagnosis not present

## 2022-02-01 DIAGNOSIS — F331 Major depressive disorder, recurrent, moderate: Secondary | ICD-10-CM | POA: Diagnosis not present

## 2022-02-27 DIAGNOSIS — F411 Generalized anxiety disorder: Secondary | ICD-10-CM | POA: Diagnosis not present

## 2022-03-13 DIAGNOSIS — F411 Generalized anxiety disorder: Secondary | ICD-10-CM | POA: Diagnosis not present

## 2022-03-15 NOTE — Telephone Encounter (Signed)
Error

## 2022-03-21 ENCOUNTER — Ambulatory Visit: Payer: Medicaid Other | Admitting: Allergy and Immunology

## 2022-03-31 DIAGNOSIS — F411 Generalized anxiety disorder: Secondary | ICD-10-CM | POA: Diagnosis not present

## 2022-06-12 DIAGNOSIS — M62838 Other muscle spasm: Secondary | ICD-10-CM

## 2022-06-12 DIAGNOSIS — Z889 Allergy status to unspecified drugs, medicaments and biological substances status: Secondary | ICD-10-CM

## 2022-06-12 DIAGNOSIS — J302 Other seasonal allergic rhinitis: Secondary | ICD-10-CM

## 2022-06-13 MED ORDER — CETIRIZINE HCL 10 MG PO TABS: 10.0000 mg | ORAL_TABLET | Freq: Every day | ORAL | 11 refills | Status: DC

## 2022-06-13 MED ORDER — EPINEPHRINE 0.3 MG/0.3ML IJ SOAJ
0.3000 mg | INTRAMUSCULAR | 1 refills | Status: DC | PRN
Start: 2022-06-13 — End: 2023-01-18

## 2022-06-13 MED ORDER — OLOPATADINE HCL 0.2 % OP SOLN: 1.0000 [drp] | OPHTHALMIC | 5 refills | Status: DC

## 2022-06-14 MED ORDER — CYCLOBENZAPRINE HCL 10 MG PO TABS: 10.0000 mg | ORAL_TABLET | Freq: Three times a day (TID) | ORAL | 0 refills | Status: DC | PRN

## 2022-06-15 ENCOUNTER — Encounter: Payer: Self-pay | Admitting: Nurse Practitioner

## 2022-06-15 ENCOUNTER — Ambulatory Visit (INDEPENDENT_AMBULATORY_CARE_PROVIDER_SITE_OTHER): Payer: Medicaid Other | Admitting: Nurse Practitioner

## 2022-06-15 VITALS — BP 108/81 | HR 84 | Temp 97.9°F | Ht 61.0 in | Wt 141.0 lb

## 2022-06-15 DIAGNOSIS — M62838 Other muscle spasm: Secondary | ICD-10-CM | POA: Diagnosis not present

## 2022-06-15 DIAGNOSIS — Z889 Allergy status to unspecified drugs, medicaments and biological substances status: Secondary | ICD-10-CM

## 2022-06-15 DIAGNOSIS — R32 Unspecified urinary incontinence: Secondary | ICD-10-CM

## 2022-06-15 DIAGNOSIS — Z0000 Encounter for general adult medical examination without abnormal findings: Secondary | ICD-10-CM

## 2022-06-15 MED ORDER — CYCLOBENZAPRINE HCL 10 MG PO TABS: 10.0000 mg | ORAL_TABLET | Freq: Three times a day (TID) | ORAL | 0 refills | Status: DC | PRN

## 2022-06-15 NOTE — Patient Instructions (Signed)
1. Muscle spasm  - cyclobenzaprine (FLEXERIL) 10 MG tablet; Take 1 tablet (10 mg total) by mouth 3 (three) times daily as needed for muscle spasms.  Dispense: 30 tablet; Refill: 0  2. Multiple allergies  - Food Allergy Profile  3. Routine health maintenance  - cyclobenzaprine (FLEXERIL) 10 MG tablet; Take 1 tablet (10 mg total) by mouth 3 (three) times daily as needed for muscle spasms.  Dispense: 30 tablet; Refill: 0 - CBC - Comprehensive metabolic panel - Lipid Panel - TSH  4. Urinary incontinence, unspecified type  - Ambulatory referral to Urogynecology   Follow up:  Follow up in 6 months or sooner

## 2022-06-15 NOTE — Assessment & Plan Note (Signed)
-   cyclobenzaprine (FLEXERIL) 10 MG tablet; Take 1 tablet (10 mg total) by mouth 3 (three) times daily as needed for muscle spasms.  Dispense: 30 tablet; Refill: 0  2. Multiple allergies  - Food Allergy Profile  3. Routine health maintenance  - cyclobenzaprine (FLEXERIL) 10 MG tablet; Take 1 tablet (10 mg total) by mouth 3 (three) times daily as needed for muscle spasms.  Dispense: 30 tablet; Refill: 0 - CBC - Comprehensive metabolic panel - Lipid Panel - TSH  4. Urinary incontinence, unspecified type  - Ambulatory referral to Urogynecology   Follow up:  Follow up in 6 months or sooner

## 2022-06-15 NOTE — Progress Notes (Signed)
@Patient  ID: Haley Reyes, female    DOB: [birth year], 46 y.o.   MRN: [id number]  Chief Complaint  Patient presents with   Urinary Incontinence    Pt needs orders for aeroflow completed.   Medication Refill    Referring provider: [name], NP  HPI  Haley Reyes 46 y.o. female  has a past medical history of Angina at rest [facility]), Anxiety, Arthritis, Fibromyalgia, Insomnia, Neuropathy, and PTSD (post-traumatic stress disorder). To the [facility] for continued pain in right foot and lower back.   Patient presents today for follow-up visit.  She continues to have issues with incontinence.  She was referred previously by Verlon Setting to urogynecology.  She never went to this appointment.  We will place referral again today.  Patient does see allergy specialist for this requesting repeat allergy testing here today.  We discussed that we can do labs but she does need to follow-up with her allergy specialist concerning any of her food allergies.  Patient is followed by psychiatry for medication management for PTSD and depression.  She has not been seeing them recently.  We discussed that she does need to call them back to get an appointment set up and if she has issues with this to let us know so we can refer her somewhere else.  Denies f/c/s, n/v/d, hemoptysis, PND, leg swelling Denies chest pain or edema    Allergies  Allergen Reactions   Shrimp [Shellfish Allergy]    Wheat Bran    Benadryl [Diphenhydramine Hcl]     unknown   Codeine Other (See Comments)    Sweating real bad felt like she was going to pass out    Watermelon Flavor Itching    SOB   Hydrocodone-Ibuprofen Palpitations   Latex Itching and Rash   Other Nausea And Vomiting and Anxiety    *Caffine*    There is no immunization history for the selected administration types on file for this patient.  Past Medical History:  Diagnosis Date   Angina at rest    Anxiety    Arthritis    Fibromyalgia    Insomnia     Neuropathy    PTSD (post-traumatic stress disorder)     Tobacco History: Social History   Tobacco Use  Smoking Status Former   Packs/day: 0.50   Types: Cigarettes   Start date: 09/04/1998   Quit date: 05/06/2021   Years since quitting: 1.1  Smokeless Tobacco Former   Counseling given: Not Answered   Outpatient Encounter Medications as of 06/15/2022  Medication Sig   cetirizine (ZYRTEC) 10 MG tablet Take 1 tablet (10 mg total) by mouth daily.   cromolyn (OPTICROM) 4 % ophthalmic solution Place 1 drop into both eyes 4 (four) times daily as needed.   cyproheptadine (PERIACTIN) 4 MG tablet Take 1 tablet (4 mg total) by mouth at bedtime. Take a half a tablet or a whole tablet.   EPINEPHrine 0.3 mg/0.3 mL IJ SOAJ injection Inject 0.3 mg into the muscle as needed for anaphylaxis.   Olopatadine HCl (PATADAY) 0.2 % SOLN Place 1 drop into both eyes 1 day or 1 dose.   RESTASIS 0.05 % ophthalmic emulsion 1 drop 2 (two) times daily.   traZODone (DESYREL) 50 MG tablet Take 1 tablet (50 mg total) by mouth at bedtime as needed for sleep.   triamcinolone (NASACORT) 55 MCG/ACT AERO nasal inhaler Place 2 sprays into the nose daily.   [DISCONTINUED] cyclobenzaprine (FLEXERIL) 10 MG tablet Take 1 tablet (  10 mg total) by mouth 3 (three) times daily as needed for muscle spasms.   cyclobenzaprine (FLEXERIL) 10 MG tablet Take 1 tablet (10 mg total) by mouth 3 (three) times daily as needed for muscle spasms.   escitalopram (LEXAPRO) 20 MG tablet Take 1 tablet (20 mg total) by mouth daily.   gabapentin (NEURONTIN) 400 MG capsule Take 1 capsule (400 mg total) by mouth at bedtime.   No facility-administered encounter medications on file as of 06/15/2022.     Review of Systems  Review of Systems  Constitutional: Negative.   HENT: Negative.    Cardiovascular: Negative.   Gastrointestinal: Negative.   Allergic/Immunologic: Negative.   Neurological: Negative.   Psychiatric/Behavioral: Negative.          Physical Exam  BP 108/81   Pulse 84   Temp 97.9 F (36.6 C) (Temporal)   Ht 5\' 1"  (1.549 m)   Wt 141 lb (64 kg)   LMP 06/12/2022 (Approximate)   SpO2 100%   BMI 26.64 kg/m   Wt Readings from Last 5 Encounters:  06/15/22 141 lb (64 kg)  11/15/21 130 lb (59 kg)  10/14/21 131 lb 2 oz (59.5 kg)  08/02/21 129 lb (58.5 kg)  07/14/21 129 lb 0.2 oz (58.5 kg)     Physical Exam Vitals and nursing note reviewed.  Constitutional:      General: She is not in acute distress.    Appearance: She is well-developed.  Cardiovascular:     Rate and Rhythm: Normal rate and regular rhythm.  Pulmonary:     Effort: Pulmonary effort is normal.     Breath sounds: Normal breath sounds.  Neurological:     Mental Status: She is alert and oriented to person, place, and time.      Lab Results:  CBC    Component Value Date/Time   WBC 4.7 05/13/2021 1114   WBC 5.4 08/01/2017 1013   RBC 4.09 05/13/2021 1114   RBC 4.50 08/01/2017 1013   HGB 12.5 05/13/2021 1114   HCT 37.5 05/13/2021 1114   PLT 277 05/13/2021 1114   MCV 92 05/13/2021 1114   MCH 30.6 05/13/2021 1114   MCH 29.8 08/01/2017 1013   MCHC 33.3 05/13/2021 1114   MCHC 33.6 08/01/2017 1013   RDW 12.0 05/13/2021 1114   LYMPHSABS 2.1 05/13/2021 1114   MONOABS 0.8 11/30/2007 2119   EOSABS 0.1 05/13/2021 1114   BASOSABS 0.0 05/13/2021 1114    BMET    Component Value Date/Time   NA 139 05/13/2021 1114   K 4.2 05/13/2021 1114   CL 103 05/13/2021 1114   CO2 24 05/13/2021 1114   GLUCOSE 84 05/13/2021 1114   GLUCOSE 89 07/20/2015 1716   BUN 9 05/13/2021 1114   CREATININE 0.74 05/13/2021 1114   CALCIUM 9.5 05/13/2021 1114   GFRNONAA 93 02/24/2020 1208   GFRAA 108 02/24/2020 1208      Assessment & Plan:   Muscle spasm - cyclobenzaprine (FLEXERIL) 10 MG tablet; Take 1 tablet (10 mg total) by mouth 3 (three) times daily as needed for muscle spasms.  Dispense: 30 tablet; Refill: 0  2. Multiple allergies  - Food  Allergy Profile  3. Routine health maintenance  - cyclobenzaprine (FLEXERIL) 10 MG tablet; Take 1 tablet (10 mg total) by mouth 3 (three) times daily as needed for muscle spasms.  Dispense: 30 tablet; Refill: 0 - CBC - Comprehensive metabolic panel - Lipid Panel - TSH  4. Urinary incontinence, unspecified type  - Ambulatory  referral to Urogynecology   Follow up:  Follow up in 6 months or sooner     [name], NP 06/15/2022

## 2022-06-18 LAB — CBC
Hematocrit: 38.1 % (ref 34.0–46.6)
Hemoglobin: 12.8 g/dL (ref 11.1–15.9)
MCH: 30.4 pg (ref 26.6–33.0)
MCHC: 33.6 g/dL (ref 31.5–35.7)
MCV: 91 fL (ref 79–97)
Platelets: 287 10*3/uL (ref 150–450)
RBC: 4.21 x10E6/uL (ref 3.77–5.28)
RDW: 12 % (ref 11.7–15.4)
WBC: 5.5 10*3/uL (ref 3.4–10.8)

## 2022-06-18 LAB — COMPREHENSIVE METABOLIC PANEL
ALT: 12 IU/L (ref 0–32)
AST: 11 IU/L (ref 0–40)
Albumin/Globulin Ratio: 1.8 (ref 1.2–2.2)
Albumin: 4.5 g/dL (ref 3.9–4.9)
Alkaline Phosphatase: 42 IU/L — ABNORMAL LOW (ref 44–121)
BUN/Creatinine Ratio: 11 (ref 9–23)
BUN: 8 mg/dL (ref 6–24)
Bilirubin Total: 0.3 mg/dL (ref 0.0–1.2)
CO2: 22 mmol/L (ref 20–29)
Calcium: 8.9 mg/dL (ref 8.7–10.2)
Chloride: 104 mmol/L (ref 96–106)
Creatinine, Ser: 0.73 mg/dL (ref 0.57–1.00)
Globulin, Total: 2.5 g/dL (ref 1.5–4.5)
Glucose: 86 mg/dL (ref 70–99)
Potassium: 3.5 mmol/L (ref 3.5–5.2)
Sodium: 140 mmol/L (ref 134–144)
Total Protein: 7 g/dL (ref 6.0–8.5)
eGFR: 104 mL/min/{1.73_m2} (ref >59)

## 2022-06-18 LAB — FOOD ALLERGY PROFILE
Allergen Corn, IgE: <0.1 kU/L
Clam IgE: 0.76 kU/L — AB
Codfish IgE: <0.1 kU/L
Egg White IgE: <0.1 kU/L
Milk IgE: <0.1 kU/L
Peanut IgE: <0.1 kU/L
Scallop IgE: 0.15 kU/L — AB
Sesame Seed IgE: <0.1 kU/L
Shrimp IgE: 0.32 kU/L — AB
Soybean IgE: <0.1 kU/L
Walnut IgE: <0.1 kU/L
Wheat IgE: 0.55 kU/L — AB

## 2022-06-18 LAB — LIPID PANEL
Chol/HDL Ratio: 3 ratio (ref 0.0–4.4)
Cholesterol, Total: 166 mg/dL (ref 100–199)
HDL: 56 mg/dL (ref >39)
LDL Chol Calc (NIH): 98 mg/dL (ref 0–99)
Triglycerides: 59 mg/dL (ref 0–149)
VLDL Cholesterol Cal: 12 mg/dL (ref 5–40)

## 2022-06-18 LAB — TSH: TSH: 0.799 u[IU]/mL (ref 0.450–4.500)

## 2022-06-29 DIAGNOSIS — R32 Unspecified urinary incontinence: Secondary | ICD-10-CM | POA: Diagnosis not present

## 2022-07-29 DIAGNOSIS — R32 Unspecified urinary incontinence: Secondary | ICD-10-CM | POA: Diagnosis not present

## 2022-08-07 ENCOUNTER — Ambulatory Visit (HOSPITAL_COMMUNITY): Payer: Medicaid Other | Admitting: Student in an Organized Health Care Education/Training Program

## 2022-08-14 ENCOUNTER — Ambulatory Visit (HOSPITAL_COMMUNITY): Payer: Medicaid Other | Admitting: Student in an Organized Health Care Education/Training Program

## 2022-08-29 DIAGNOSIS — R3981 Functional urinary incontinence: Secondary | ICD-10-CM | POA: Diagnosis not present

## 2022-08-29 DIAGNOSIS — R32 Unspecified urinary incontinence: Secondary | ICD-10-CM | POA: Diagnosis not present

## 2022-09-01 ENCOUNTER — Encounter: Payer: Self-pay | Admitting: Nurse Practitioner

## 2022-09-01 ENCOUNTER — Telehealth: Payer: Medicaid Other | Admitting: Nurse Practitioner

## 2022-09-01 DIAGNOSIS — G629 Polyneuropathy, unspecified: Secondary | ICD-10-CM

## 2022-09-01 MED ORDER — GABAPENTIN 300 MG PO CAPS: 300.0000 mg | ORAL_CAPSULE | Freq: Three times a day (TID) | ORAL | 3 refills | Status: DC

## 2022-09-01 MED ORDER — RESTASIS 0.05 % OP EMUL: 1.0000 [drp] | Freq: Two times a day (BID) | OPHTHALMIC | 2 refills | Status: AC

## 2022-09-01 NOTE — Progress Notes (Signed)
Virtual Visit via Video Note  I connected with Haley Reyes on 09/01/22 at  8:00 AM EST by a video enabled telemedicine application and verified that I am speaking with the correct person using two identifiers.  Location: Patient: home Provider: office   I discussed the limitations of evaluation and management by telemedicine and the availability of in person appointments. The patient expressed understanding and agreed to proceed.  History of Present Illness:  Patient presents today for acute visit through video visit.  Patient states that she is having increased pain to her lower extremities.  Patient does have a history of neuropathy and is taking Neurontin 400 mg at bedtime.  We discussed that we can increase this medication to 3 times daily and we will refer patient to neurology.  This has been an ongoing issue and is progressively worsening over the past few years.  Patient is also requesting refill on eyedrops today. Denies f/c/s, n/v/d, hemoptysis, PND, leg swelling Denies chest pain or edema     Observations/Objective:     06/15/2022    9:18 AM 11/15/2021    9:06 AM 10/14/2021    8:31 AM  Vitals with BMI  Height 5\' 1"  5\' 1"  5\' 0"   Weight 141 lbs 130 lbs 131 lbs 2 oz  BMI 26.66 24.58 25.61  Systolic 108 96 113  Diastolic 81 68 75  Pulse 84 99 96      Assessment and Plan:  1. Neuropathy  - gabapentin (NEURONTIN) 300 MG capsule; Take 1 capsule (300 mg total) by mouth 3 (three) times daily.  Dispense: 90 capsule; Refill: 3 - Ambulatory referral to Neurology   Follow up:  Follow up as scheduled     I discussed the assessment and treatment plan with the patient. The patient was provided an opportunity to ask questions and all were answered. The patient agreed with the plan and demonstrated an understanding of the instructions.   The patient was advised to call back or seek an in-person evaluation if the symptoms worsen or if the condition fails to improve as  anticipated.  I provided 23 minutes of non-face-to-face time during this encounter.   , NP

## 2022-09-01 NOTE — Patient Instructions (Signed)
1. Neuropathy  - gabapentin (NEURONTIN) 300 MG capsule; Take 1 capsule (300 mg total) by mouth 3 (three) times daily.  Dispense: 90 capsule; Refill: 3 - Ambulatory referral to Neurology   Follow up:  Follow up as scheduled

## 2022-09-12 ENCOUNTER — Encounter: Payer: Self-pay | Admitting: Neurology

## 2022-09-29 DIAGNOSIS — R32 Unspecified urinary incontinence: Secondary | ICD-10-CM | POA: Diagnosis not present

## 2022-09-29 DIAGNOSIS — R3981 Functional urinary incontinence: Secondary | ICD-10-CM | POA: Diagnosis not present

## 2022-10-09 ENCOUNTER — Encounter (HOSPITAL_COMMUNITY): Payer: Self-pay | Admitting: Student in an Organized Health Care Education/Training Program

## 2022-10-09 ENCOUNTER — Ambulatory Visit (HOSPITAL_BASED_OUTPATIENT_CLINIC_OR_DEPARTMENT_OTHER): Payer: Medicaid Other | Admitting: Student in an Organized Health Care Education/Training Program

## 2022-10-09 VITALS — BP 114/80 | Wt 131.0 lb

## 2022-10-09 DIAGNOSIS — F331 Major depressive disorder, recurrent, moderate: Secondary | ICD-10-CM

## 2022-10-09 DIAGNOSIS — F4310 Post-traumatic stress disorder, unspecified: Secondary | ICD-10-CM | POA: Diagnosis not present

## 2022-10-09 MED ORDER — ESCITALOPRAM OXALATE 20 MG PO TABS: 20.0000 mg | ORAL_TABLET | Freq: Every day | ORAL | 2 refills | Status: DC

## 2022-10-09 MED ORDER — PRAZOSIN HCL 1 MG PO CAPS: 1.0000 mg | ORAL_CAPSULE | Freq: Every day | ORAL | 2 refills | Status: DC

## 2022-10-09 MED ORDER — TRAZODONE HCL 50 MG PO TABS: 50.0000 mg | ORAL_TABLET | Freq: Every evening | ORAL | 2 refills | Status: DC | PRN
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
## 2022-10-10 NOTE — Progress Notes (Deleted)
Initial neurology clinic note  SERVICE DATE: 10/18/22  Reason for Evaluation: Consultation requested by [name], NP for an opinion regarding pain in legs. My final recommendations will be communicated back to the requesting physician by way of shared medical record or letter to requesting physician via [place] mail.  HPI: This is [name], a 47 y.o. ***-handed female with a medical history of PTSD, anxiety, arthritis, fibromyalgia, insomnia, former smoker, vit D deficiency*** who presents to neurology clinic with the chief complaint of pain in legs***. The patient is accompanied by ***.  ***  Having increased pain in legs Hx of neuropathy (why?***) Taking gabapentin 300 mg TID (increased from 400 mg qhs on 09/01/22)  On lexapro 20 mg qd for *** Prazosin for nightmares? Trazodone*** Any vitamins?***  The patient has not*** had similar episodes of symptoms in the past. ***  Muscle bulk loss? *** Muscle pain? ***  Cramps/Twitching? *** Suggestion of myotonia/difficulty relaxing after contraction? ***  Fatigable weakness?*** Does strength improve after brief exercise?***  Able to brush hair/teeth without difficulty? *** Able to button shirts/use zips? *** Clumsiness/dropping grasped objects?*** Can you arise from squatted position easily? *** Able to get out of chair without using arms? *** Able to walk up steps easily? *** Use an assistive device to walk? *** Significant imbalance with walking? *** Falls?*** Any change in urine color, especially after exertion/physical activity? ***  The patient denies*** symptoms suggestive of oculobulbar weakness including diplopia, ptosis, dysphagia, poor saliva control, dysarthria/dysphonia, impaired mastication, facial weakness/droop.  There are no*** neuromuscular respiratory weakness symptoms, particularly orthopnea>dyspnea.   Pseudobulbar affect is absent***.  The patient does not*** report symptoms referable to autonomic  dysfunction including impaired sweating, heat or cold intolerance, excessive mucosal dryness, gastroparetic early satiety, postprandial abdominal bloating, constipation, bowel or bladder dyscontrol, erectile dysfunction*** or syncope/presyncope/orthostatic intolerance.  There are no*** complaints relating to other symptoms of small fiber modalities including paresthesia/pain.  The patient has not *** noticed any recent skin rashes nor does he*** report any constitutional symptoms like fever, night sweats, anorexia or unintentional weight loss.  EtOH use: ***  Restrictive diet? *** Family history of neuropathy/myopathy/NM disease?***  Previous labs, electrodiagnostics, and neuroimaging are summarized below, but pertinent findings include***  Any biopsy done? *** Current medications being tried for the patient's symptoms include ***  Prior medications that have been tried: ***   MEDICATIONS:  Outpatient Encounter Medications as of 10/18/2022  Medication Sig   cetirizine (ZYRTEC) 10 MG tablet Take 1 tablet (10 mg total) by mouth daily.   cromolyn (OPTICROM) 4 % ophthalmic solution Place 1 drop into both eyes 4 (four) times daily as needed.   cyclobenzaprine (FLEXERIL) 10 MG tablet Take 1 tablet (10 mg total) by mouth 3 (three) times daily as needed for muscle spasms. (Patient not taking: Reported on 09/01/2022)   cyproheptadine (PERIACTIN) 4 MG tablet Take 1 tablet (4 mg total) by mouth at bedtime. Take a half a tablet or a whole tablet.   EPINEPHrine 0.3 mg/0.3 mL IJ SOAJ injection Inject 0.3 mg into the muscle as needed for anaphylaxis.   escitalopram (LEXAPRO) 20 MG tablet Take 1 tablet (20 mg total) by mouth daily.   gabapentin (NEURONTIN) 300 MG capsule Take 1 capsule (300 mg total) by mouth 3 (three) times daily.   Olopatadine HCl (PATADAY) 0.2 % SOLN Place 1 drop into both eyes 1 day or 1 dose.   prazosin (MINIPRESS) 1 MG capsule Take 1 capsule (1 mg total)  by mouth at bedtime.    RESTASIS 0.05 % ophthalmic emulsion Place 1 drop into both eyes 2 (two) times daily.   traZODone (DESYREL) 50 MG tablet Take 1 tablet (50 mg total) by mouth at bedtime as needed for sleep.   triamcinolone (NASACORT) 55 MCG/ACT AERO nasal inhaler Place 2 sprays into the nose daily.   No facility-administered encounter medications on file as of 10/18/2022.    PAST MEDICAL HISTORY: Past Medical History:  Diagnosis Date   Angina at rest    Anxiety    Arthritis    Fibromyalgia    Insomnia    Neuropathy    PTSD (post-traumatic stress disorder)     PAST SURGICAL HISTORY: Past Surgical History:  Procedure Laterality Date   CHOLECYSTECTOMY  2000   cystic fibroids     TUBAL LIGATION      ALLERGIES: Allergies  Allergen Reactions   Shrimp [Shellfish Allergy]    Wheat Bran    Benadryl [Diphenhydramine Hcl]     unknown   Codeine Other (See Comments)    Sweating real bad felt like she was going to pass out    Watermelon Flavor Itching    SOB   Hydrocodone-Ibuprofen Palpitations   Latex Itching and Rash   Other Nausea And Vomiting and Anxiety    *Caffine*    FAMILY HISTORY: Family History  Problem Relation Age of Onset   Lung cancer Father    Hypertension Father    COPD Maternal Grandmother    Congestive Heart Failure Maternal Grandmother    Allergies Other        children x 4   Asthma Other        children x 4   Diabetes Other        fathers siblings   Allergies Brother    Congestive Heart Failure Other        aunt   Sleep apnea Brother    Hypertension Mother     SOCIAL HISTORY: Social History   Tobacco Use   Smoking status: Former    Packs/day: 0.50    Types: Cigarettes    Start date: 09/04/1998    Quit date: 05/06/2021    Years since quitting: 1.4   Smokeless tobacco: Former  Scientific laboratory technician Use: Never used  Substance Use Topics   Alcohol use: Yes    Comment: glass of red wine occassionally during the week. None at the time d/t new medications.     Drug use: No   Social History   Social History Narrative   Not on file     OBJECTIVE: PHYSICAL EXAM: There were no vitals taken for this visit.  General:*** General appearance: Awake and alert. No distress. Cooperative with exam.  Skin: No obvious rash or jaundice. HEENT: Atraumatic. Anicteric. Lungs: Non-labored breathing on room air  Heart: Regular Abdomen: Soft, non tender. Extremities: No edema. No obvious deformity.  Musculoskeletal: No obvious joint swelling. Psych: Affect appropriate.  Neurological: Mental Status: Alert. Speech fluent. No pseudobulbar affect Cranial Nerves: CNII: No RAPD. Visual fields grossly intact. CNIII, IV, VI: PERRL. No nystagmus. EOMI. CN V: Facial sensation intact bilaterally to fine touch. [place] clench strong. Jaw jerk***. CN VII: Facial muscles symmetric and strong. No ptosis at rest or after sustained upgaze***. CN VIII: Hearing grossly intact bilaterally. CN IX: No hypophonia. CN X: Palate elevates symmetrically. CN XI: Full strength shoulder shrug bilaterally. CN XII: Tongue protrusion full and midline. No atrophy or fasciculations. No significant dysarthria*** Motor:  Tone is ***. *** fasciculations in *** extremities. *** atrophy. No grip or percussive myotonia.***  Individual muscle group testing (MRC grade out of 5):  Movement     Neck flexion ***    Neck extension ***     Right Left   Shoulder abduction *** ***   Shoulder adduction *** ***   Shoulder ext rotation *** ***   Shoulder int rotation *** ***   Elbow flexion *** ***   Elbow extension *** ***   Wrist extension *** ***   Wrist flexion *** ***   Finger abduction - FDI *** ***   Finger abduction - ADM *** ***   Finger extension *** ***   Finger distal flexion - 2/3 *** ***   Finger distal flexion - 4/5 *** ***   Thumb flexion - FPL *** ***   Thumb abduction - APB *** ***    Hip flexion *** ***   Hip extension *** ***   Hip adduction *** ***   Hip  abduction *** ***   Knee extension *** ***   Knee flexion *** ***   Dorsiflexion *** ***   Plantarflexion *** ***   Inversion *** ***   Eversion *** ***   Great toe extension *** ***   Great toe flexion *** ***     Reflexes:  Right Left   Bicep *** ***   Tricep *** ***   BrRad *** ***   Knee *** ***   Ankle *** ***    Pathological Reflexes: Babinski: *** response bilaterally*** Hoffman: *** Troemner: *** Pectoral: *** Palmomental: *** Facial: *** Midline tap: *** Sensation: Pinprick: *** Vibration: *** Temperature: *** Proprioception: *** Coordination: Intact finger-to- nose-finger bilaterally. Romberg negative.*** Gait: Able to rise from chair with arms crossed unassisted. Normal, narrow-based gait. Able to tandem walk. Able to walk on toes and heels.***  Lab and Test Review: Internal labs: 06/15/22: Normal or unremarkable: TSH, CMP, CBC  HbA1c (05/13/21): 5.2  02/24/20: Vit D: 25.2 B12: 524  External labs: ***  Imaging/Procedures: EMG of RUE (08/10/21 by Laurence Spates, MD): EMG & NCV Findings: Evaluation of the right median (across palm) sensory nerve showed prolonged distal peak latency (Wrist, 4.1 ms) and prolonged distal peak latency (Palm, 2.2 ms).  The right ulnar sensory nerve showed prolonged distal peak latency (4.1 ms) and decreased conduction velocity (Wrist-5th Digit, 34 m/s).  All remaining nerves (as indicated in the following tables) were within normal limits.     All examined muscles (as indicated in the following table) showed no evidence of electrical instability.     Impression: The above electrodiagnostic study is essentially NORMAL focal nerve entrapment, brachial plexopathy or cervical radiculopathy.  The noted slowing of the sensory nerve action potentials of the median and ulnar nerves is temperature artifact.  Despite using in room heater patient's skin temperature stayed at 28.3 C.  The temperature artifact can be noted with the increased  amplitude and widened curve.    **This electrodiagnostic study cannot rule out small fiber polyneuropathy and dysesthesias from central pain syndromes such as stroke or central pain sensitization syndromes such as fibromyalgia.  Myotomal referral pain from trigger points is also not excluded.   Recommendations: 1.  Follow-up with referring physician. 2.  Continue current management of symptoms.  Consider neurology consultation.  ASSESSMENT: Haley Reyes is a 47 y.o. female who presents for evaluation of ***. *** has a relevant medical history of ***. *** neurological examination is pertinent for ***. Available  diagnostic data is significant for ***. This constellation of symptoms and objective data would most likely localize to ***. ***  PLAN: -Blood work: ***B1, B12, HbA1c, IFE, vit D ***Vit D 1000 IU daily ***EMG/skin biopsy?  -Return to clinic ***  The impression above as well as the plan as outlined below were extensively discussed with the patient (in the company of ***) who voiced understanding. All questions were answered to their satisfaction.  The patient was counseled on pertinent fall precautions per the printed material provided today, and as noted under the "Patient Instructions" section below.***  When available, results of the above investigations and possible further recommendations will be communicated to the patient via telephone/MyChart. Patient to call office if not contacted after expected testing turnaround time.   Total time spent reviewing records, interview, history/exam, documentation, and coordination of care on day of encounter:  *** min   Thank you for allowing me to participate in patient's care.  If I can answer any additional questions, I would be pleased to do so.  Kai Levins, MD   CC: [name], NP [place] [address]  CC: Referring provider: [name], NP [address] [place],  [place]  [id number]

## 2022-10-18 ENCOUNTER — Ambulatory Visit: Payer: Medicaid Other | Admitting: Neurology

## 2022-10-20 ENCOUNTER — Ambulatory Visit: Payer: Self-pay | Admitting: Nurse Practitioner

## 2022-10-30 DIAGNOSIS — R32 Unspecified urinary incontinence: Secondary | ICD-10-CM | POA: Diagnosis not present

## 2022-10-30 DIAGNOSIS — R3981 Functional urinary incontinence: Secondary | ICD-10-CM | POA: Diagnosis not present

## 2022-11-02 NOTE — Progress Notes (Signed)
Initial neurology clinic note  SERVICE DATE: 11/15/22  Reason for Evaluation: Consultation requested by [name], NP for an opinion regarding numbness and tingling in legs and hands. My final recommendations will be communicated back to the requesting physician by way of shared medical record or letter to requesting physician via [place] mail.  HPI: This is [name], a 47 y.o. left-handed female with a medical history of PTSD, anxiety, arthritis, fibromyalgia, insomnia, former smoker, vit D deficiency, OA, and depression who presents to neurology clinic with the chief complaint of numbness and tingling in legs and hands. The patient is alone today.  Patient has pain in both feet. When she walks, it feels like she is not even walking "on her own feet." She has numbness in her feet and hands. If she tries to lay on her side, she can have numbness on that side, especially on her left. Sitting on the toilet can cause both legs to go numb. This has been going on for a few years. At first it was off and on, but has become worse and more consistent over time. She endorses some weakness in her hands. She also has imbalance.  She endorses neck and back pain. She mentions a history of disc degeneration and pinched nerves in her back.  She had an EMG of RUE in 2022 (see below) that was normal. She was told to wear a brace for carpal tunnel which helps when she wears it. She does not wear it all the time though.  Patient is currently taking gabapentin 300 mg TID. This helps some.    Of note, patient is on lexapro 20 mg qd for depression. She does not take any vitamins.  She does not report any constitutional symptoms like fever, night sweats, anorexia or unintentional weight loss.  EtOH use: Previously drank more, minimal now  Restrictive diet? No Family history of neuropathy/myopathy/neurologic disease? None known, some back issues in the family   MEDICATIONS:  Outpatient Encounter  Medications as of 11/15/2022  Medication Sig   cetirizine (ZYRTEC) 10 MG tablet Take 1 tablet (10 mg total) by mouth daily.   cromolyn (OPTICROM) 4 % ophthalmic solution Place 1 drop into both eyes 4 (four) times daily as needed.   cyproheptadine (PERIACTIN) 4 MG tablet Take 1 tablet (4 mg total) by mouth at bedtime. Take a half a tablet or a whole tablet.   EPINEPHrine 0.3 mg/0.3 mL IJ SOAJ injection Inject 0.3 mg into the muscle as needed for anaphylaxis.   escitalopram (LEXAPRO) 20 MG tablet Take 1 tablet (20 mg total) by mouth daily.   gabapentin (NEURONTIN) 300 MG capsule Take 1 capsule (300 mg total) by mouth 3 (three) times daily.   Olopatadine HCl (PATADAY) 0.2 % SOLN Place 1 drop into both eyes 1 day or 1 dose.   RESTASIS 0.05 % ophthalmic emulsion Place 1 drop into both eyes 2 (two) times daily.   traZODone (DESYREL) 50 MG tablet Take 1 tablet (50 mg total) by mouth at bedtime as needed for sleep.   triamcinolone (NASACORT) 55 MCG/ACT AERO nasal inhaler Place 2 sprays into the nose daily.   cyclobenzaprine (FLEXERIL) 10 MG tablet Take 1 tablet (10 mg total) by mouth 3 (three) times daily as needed for muscle spasms. (Patient not taking: Reported on 09/01/2022)   prazosin (MINIPRESS) 1 MG capsule Take 1 capsule (1 mg total) by mouth at bedtime. (Patient not taking: Reported on 11/15/2022)   No facility-administered encounter medications  on file as of 11/15/2022.    PAST MEDICAL HISTORY: Past Medical History:  Diagnosis Date   Angina at rest    Anxiety    Arthritis    Fibromyalgia    Insomnia    Neuropathy    PTSD (post-traumatic stress disorder)     PAST SURGICAL HISTORY: Past Surgical History:  Procedure Laterality Date   CHOLECYSTECTOMY  2000   cystic fibroids     TUBAL LIGATION      ALLERGIES: Allergies  Allergen Reactions   Shrimp [Shellfish Allergy]    Wheat    Benadryl [Diphenhydramine Hcl]     unknown   Codeine Other (See Comments)    Sweating real bad felt  like she was going to pass out    Watermelon Flavor Itching    SOB   Hydrocodone-Ibuprofen Palpitations   Latex Itching and Rash   Other Nausea And Vomiting and Anxiety    *Caffine*    FAMILY HISTORY: Family History  Problem Relation Age of Onset   Lung cancer Father    Hypertension Father    COPD Maternal Grandmother    Congestive Heart Failure Maternal Grandmother    Allergies Other        children x 4   Asthma Other        children x 4   Diabetes Other        fathers siblings   Allergies Brother    Congestive Heart Failure Other        aunt   Sleep apnea Brother    Hypertension Mother     SOCIAL HISTORY: Social History   Tobacco Use   Smoking status: Former    Packs/day: 0.50    Types: Cigarettes    Start date: 09/04/1998    Quit date: 05/06/2021    Years since quitting: 1.5   Smokeless tobacco: Former  Scientific laboratory technician Use: Never used  Substance Use Topics   Alcohol use: Yes    Comment: glass of red wine occassionally during the week. None at the time d/t new medications.    Drug use: No   Social History   Social History Narrative   Are you right handed or left handed? left   Are you currently employed ? no   What is your current occupation?disability   Do you live at home [place]   Who lives with you? daughter   What type of home do you live in: 1 story or 2 story? one    Caffeine none     OBJECTIVE: PHYSICAL EXAM: BP 100/72   Pulse 97   Ht 5\' 1"  (1.549 m)   Wt 129 lb (58.5 kg)   SpO2 100%   BMI 24.37 kg/m   General: General appearance: Awake and alert. No distress. Cooperative with exam.  Skin: No obvious rash or jaundice. HEENT: Atraumatic. Anicteric. Lungs: Non-labored breathing on room air  Extremities: No edema. No obvious deformity.  Musculoskeletal: No obvious joint swelling. Tenderness throughout all extremities to palpation Psych: Affect appropriate.  Neurological: Mental Status: Alert. Speech fluent. No pseudobulbar  affect Cranial Nerves: CNII: No RAPD. Visual fields grossly intact. CNIII, IV, VI: PERRL. No nystagmus. EOMI. CN V: Facial sensation intact bilaterally to fine touch. CN VII: Facial muscles symmetric and strong. No ptosis at rest. CN VIII: Hearing grossly intact bilaterally. CN IX: No hypophonia. CN X: Palate elevates symmetrically. CN XI: Full strength shoulder shrug bilaterally. CN XII: Tongue protrusion full and midline. No atrophy or fasciculations.  No significant dysarthria Motor: Tone is normal. No atrophy. Giveway weakness in many muscle groups due to pain, but strength at least 5-/5 in all muscle groups  Reflexes:  Right Left   Bicep 2+ 2+   Tricep 2+ 2+   BrRad 2+ 2+   Knee 2+ 2+   Ankle 2+ 2+    Pathological Reflexes: Babinski: mute response bilaterally Hoffman: absent bilaterally Troemner: absent bilaterally Sensation: Pinprick: Reduced on palms of bilateral hands (right > left), otherwise intact Vibration: Diminished in bilateral great toes, otherwise intact Proprioception: Intact in bilateral great toes Coordination: Intact finger-to- nose-finger bilaterally. Romberg negative. Gait: Able to rise from chair with arms crossed unassisted. Normal, narrow-based gait. Able to tandem walk. Able to walk on toes and heels.  Lab and Test Review: Internal labs:  06/15/22: Normal or unremarkable: TSH, CMP, CBC   HbA1c (05/13/21): 5.2   02/24/20: Vit D: 25.2 B12: 524   Imaging/Procedures: EMG of RUE (08/10/21 by Laurence Spates, MD): EMG & NCV Findings: Evaluation of the right median (across palm) sensory nerve showed prolonged distal peak latency (Wrist, 4.1 ms) and prolonged distal peak latency (Palm, 2.2 ms).  The right ulnar sensory nerve showed prolonged distal peak latency (4.1 ms) and decreased conduction velocity (Wrist-5th Digit, 34 m/s).  All remaining nerves (as indicated in the following tables) were within normal limits.     All examined muscles (as indicated  in the following table) showed no evidence of electrical instability.     Impression: The above electrodiagnostic study is essentially NORMAL focal nerve entrapment, brachial plexopathy or cervical radiculopathy.  The noted slowing of the sensory nerve action potentials of the median and ulnar nerves is temperature artifact.  Despite using in room heater patient's skin temperature stayed at 28.3 C.  The temperature artifact can be noted with the increased amplitude and widened curve.    **This electrodiagnostic study cannot rule out small fiber polyneuropathy and dysesthesias from central pain syndromes such as stroke or central pain sensitization syndromes such as fibromyalgia.  Myotomal referral pain from trigger points is also not excluded.   Recommendations: 1.  Follow-up with referring physician. 2.  Continue current management of symptoms.  Consider neurology consultation.  Lumbar xray (02/03/2002): IMPRESSION: NORMAL STUDY.   ASSESSMENT: Haley Reyes is a 47 y.o. female who presents for evaluation of numbness and tingling and pain in arms and legs. She has a relevant medical history of PTSD, anxiety, arthritis, fibromyalgia, insomnia, former smoker, vit D deficiency, OA, and depression. Her neurological examination is pertinent for give way weakness and decreased distal lower extremity sensation to vibration. Available diagnostic data is significant for EMG of RUE that was normal in 2022. HbA1c in 2022 and B12 in 2021 were also normal. The etiology of patient's symptoms is currently unclear and may be multifactorial with contributions from OA, fibromyalgia, but may also include peripheral neuropathy or lumbar spine disease. I will send labs to look for treatable causes and get LE EMG to further clarify.  PLAN: -Blood work: B1, B12, HbA1c, IFE, vit D -EMG: PN (R > L) -Recommend Vit D 1000 IU daily -Continue Gabapentin 300 mg TID -Recommend Lidocaine cream PRN  -Return to clinic to be  determined  The impression above as well as the plan as outlined below were extensively discussed with the patient who voiced understanding. All questions were answered to their satisfaction.  When available, results of the above investigations and possible further recommendations will be communicated to the patient via  telephone/MyChart. Patient to call office if not contacted after expected testing turnaround time.   Total time spent reviewing records, interview, history/exam, documentation, and coordination of care on day of encounter:  60 min   Thank you for allowing me to participate in patient's care.  If I can answer any additional questions, I would be pleased to do so.  Kai Levins, MD   CC: Berdine [place] Pope, MD [address] [place] [address]  CC: Referring provider: [name], NP [phone] [place],  [place] [address]

## 2022-11-09 ENCOUNTER — Ambulatory Visit: Payer: Self-pay | Admitting: Internal Medicine

## 2022-11-15 ENCOUNTER — Other Ambulatory Visit (INDEPENDENT_AMBULATORY_CARE_PROVIDER_SITE_OTHER): Payer: Medicaid Other

## 2022-11-15 ENCOUNTER — Encounter: Payer: Self-pay | Admitting: Neurology

## 2022-11-15 ENCOUNTER — Ambulatory Visit (INDEPENDENT_AMBULATORY_CARE_PROVIDER_SITE_OTHER): Payer: Medicaid Other | Admitting: Neurology

## 2022-11-15 VITALS — BP 100/72 | HR 97 | Ht 61.0 in | Wt 129.0 lb

## 2022-11-15 DIAGNOSIS — E559 Vitamin D deficiency, unspecified: Secondary | ICD-10-CM | POA: Diagnosis not present

## 2022-11-15 DIAGNOSIS — Z131 Encounter for screening for diabetes mellitus: Secondary | ICD-10-CM

## 2022-11-15 DIAGNOSIS — R200 Anesthesia of skin: Secondary | ICD-10-CM | POA: Diagnosis not present

## 2022-11-15 DIAGNOSIS — R209 Unspecified disturbances of skin sensation: Secondary | ICD-10-CM | POA: Diagnosis not present

## 2022-11-15 DIAGNOSIS — G8929 Other chronic pain: Secondary | ICD-10-CM

## 2022-11-15 DIAGNOSIS — M545 Low back pain, unspecified: Secondary | ICD-10-CM | POA: Diagnosis not present

## 2022-11-15 DIAGNOSIS — R202 Paresthesia of skin: Secondary | ICD-10-CM

## 2022-11-15 LAB — VITAMIN B12: Vitamin B-12: 237 pg/mL (ref 211–911)

## 2022-11-15 LAB — HEMOGLOBIN A1C: Hgb A1c MFr Bld: 5.4 % (ref 4.6–6.5)

## 2022-11-15 LAB — VITAMIN D 25 HYDROXY (VIT D DEFICIENCY, FRACTURES): VITD: 13.36 ng/mL — ABNORMAL LOW (ref 30.00–100.00)

## 2022-11-15 NOTE — Patient Instructions (Addendum)
I would like to investigate your symptoms further with the following: -Blood work today -Muscle and nerve test called EMG (see more information below)  I recommend you continue gabapentin 300 mg three times daily.  You can also try Lidocaine cream as needed. Apply wear you have pain, tingling, or burning. Wear gloves to prevent your hands being numb. This can be bought over the counter at any drug store or online.  I recommend you pick up vitamin D supplement, 1000 international units (IU) daily as your vitamin D has been low in the past.  When I have your results, I will be in touch about next steps.  The physicians and staff at [facility] are committed to providing excellent care. You may receive a survey requesting feedback about your experience at our office. We strive to receive "very good" responses to the survey questions. If you feel that your experience would prevent you from giving the office a "very good " response, please contact our office to try to remedy the situation. We may be reached at [phone]. Thank you for taking the time out of your busy day to complete the survey.  Kai Levins, MD [facility]  ELECTROMYOGRAM AND NERVE CONDUCTION STUDIES (EMG/NCS) INSTRUCTIONS  How to Prepare The neurologist conducting the EMG will need to know if you have certain medical conditions. Tell the neurologist and other EMG lab personnel if you: Have a pacemaker or any other electrical medical device Take blood-thinning medications Have hemophilia, a blood-clotting disorder that causes prolonged bleeding Bathing Take a shower or bath shortly before your exam in order to remove oils from your skin. Don't apply lotions or creams before the exam.  What to Expect You'll likely be asked to change into a hospital gown for the procedure and lie down on an examination table. The following explanations can help you understand what will happen during the exam.  Electrodes. The  neurologist or a technician places surface electrodes at various locations on your skin depending on where you're experiencing symptoms. Or the neurologist may insert needle electrodes at different sites depending on your symptoms.  Sensations. The electrodes will at times transmit a tiny electrical current that you may feel as a twinge or spasm. The needle electrode may cause discomfort or pain that usually ends shortly after the needle is removed. If you are concerned about discomfort or pain, you may want to talk to the neurologist about taking a short break during the exam.  Instructions. During the needle EMG, the neurologist will assess whether there is any spontaneous electrical activity when the muscle is at rest - activity that isn't present in healthy muscle tissue - and the degree of activity when you slightly contract the muscle.  He or she will give you instructions on resting and contracting a muscle at appropriate times. Depending on what muscles and nerves the neurologist is examining, he or she may ask you to change positions during the exam.  After your EMG You may experience some temporary, minor bruising where the needle electrode was inserted into your muscle. This bruising should fade within several days. If it persists, contact your primary care doctor.

## 2022-11-21 ENCOUNTER — Encounter: Payer: Self-pay | Admitting: Neurology

## 2022-11-21 LAB — IMMUNOFIXATION ELECTROPHORESIS
IgG (Immunoglobin G), Serum: 1464 mg/dL (ref 600–1640)
IgM, Serum: 137 mg/dL (ref 50–300)
Immunoglobulin A: 355 mg/dL — ABNORMAL HIGH (ref 47–310)

## 2022-11-21 LAB — VITAMIN B1: Vitamin B1 (Thiamine): 6 nmol/L — ABNORMAL LOW (ref 8–30)

## 2022-11-22 ENCOUNTER — Ambulatory Visit (HOSPITAL_COMMUNITY): Payer: Medicaid Other | Admitting: Student in an Organized Health Care Education/Training Program

## 2022-11-22 NOTE — Progress Notes (Deleted)
[place] MD/PA/NP OP Progress Note  11/22/2022 10:16 AM Haley Reyes  MRN:  [id number]  Chief Complaint: No chief complaint on file.  HPI: Haley Reyes is a 47 yo patient w/ a PPH of [place] PTSD, MDD, and anxiety w/ panic attacks. Current medication regimen: Lexapro 20 mg daily Prazosin 1 mg nightly Trazodone 50 mg nightly  Rx gabapentin 300 mg 3 times daily via neurology  PTSD (hypervigilance/nightmares)- Mood Sleep Visit Diagnosis: No diagnosis found.  Past Psychiatric History: ***  Past Medical History:  Past Medical History:  Diagnosis Date   Angina at rest    Anxiety    Arthritis    Fibromyalgia    Insomnia    Neuropathy    PTSD (post-traumatic stress disorder)     Past Surgical History:  Procedure Laterality Date   CHOLECYSTECTOMY  2000   cystic fibroids     TUBAL LIGATION      Family Psychiatric History: ***  Family History:  Family History  Problem Relation Age of Onset   Lung cancer Father    Hypertension Father    COPD Maternal Grandmother    Congestive Heart Failure Maternal Grandmother    Allergies Other        children x 4   Asthma Other        children x 4   Diabetes Other        fathers siblings   Allergies Brother    Congestive Heart Failure Other        aunt   Sleep apnea Brother    Hypertension Mother     Social History:  Social History   Socioeconomic History   Marital status: Single    Spouse name: Not on file   Number of children: 4   Years of education: Not on file   Highest education level: Not on file  Occupational History   Occupation: unemployed    Employer: NOT EMPLOYED  Tobacco Use   Smoking status: Former    Packs/day: .5    Types: Cigarettes    Start date: 09/04/1998    Quit date: 05/06/2021    Years since quitting: 1.5   Smokeless tobacco: Former  Scientific laboratory technician Use: Never used  Substance and Sexual Activity   Alcohol use: Yes    Comment: glass of red wine occassionally during the week. None at the time  d/t new medications.    Drug use: No   Sexual activity: Not Currently  Other Topics Concern   Not on file  Social History Narrative   Are you right handed or left handed? left   Are you currently employed ? no   What is your current occupation?disability   Do you live at home [place]   Who lives with you? daughter   What type of home do you live in: 1 story or 2 story? one    Caffeine none   Social Determinants of Health   Financial Resource Strain: Medium Risk (09/12/2018)   Overall Financial Resource Strain (CARDIA)    Difficulty of Paying Living Expenses: Somewhat hard  Food Insecurity: No Food Insecurity (09/12/2018)   Hunger Vital Sign    Worried About Running Out of Food in the Last Year: Never true    Ran Out of Food in the Last Year: Never true  Transportation Needs: No Transportation Needs (09/12/2018)   PRAPARE - Hydrologist (Medical): No    Lack of Transportation (Non-Medical): No  Physical Activity:  Inactive (09/12/2018)   Exercise Vital Sign    Days of Exercise per Week: 0 days    Minutes of Exercise per Session: 0 min  Stress: Stress Concern Present (09/12/2018)   [place]    Feeling of Stress : Rather much  Social Connections: Unknown (09/12/2018)   Social Connection and Isolation Panel [NHANES]    Frequency of Communication with Friends and Family: Twice a week    Frequency of Social Gatherings with Friends and Family: Once a week    Attends Religious Services: Never    Marine scientist or Organizations: No    Attends Archivist Meetings: Never    Marital Status: Not on file    Allergies:  Allergies  Allergen Reactions   Shrimp [Shellfish Allergy]    Wheat    Benadryl [Diphenhydramine Hcl]     unknown   Codeine Other (See Comments)    Sweating real bad felt like she was going to pass out    Watermelon Flavor Itching    SOB   Hydrocodone-Ibuprofen  Palpitations   Latex Itching and Rash   Other Nausea And Vomiting and Anxiety    *Caffine*    Metabolic Disorder Labs: Lab Results  Component Value Date   HGBA1C 5.4 11/15/2022   No results found for: "PROLACTIN" Lab Results  Component Value Date   CHOL 166 06/15/2022   TRIG 59 06/15/2022   HDL 56 06/15/2022   CHOLHDL 3.0 06/15/2022   LDLCALC 98 06/15/2022   LDLCALC 69 05/13/2021   Lab Results  Component Value Date   TSH 0.799 06/15/2022   TSH 0.758 02/24/2020    Therapeutic Level Labs: No results found for: "LITHIUM" No results found for: "VALPROATE" No results found for: "CBMZ"  Current Medications: Current Outpatient Medications  Medication Sig Dispense Refill   cetirizine (ZYRTEC) 10 MG tablet Take 1 tablet (10 mg total) by mouth daily. 30 tablet 11   cromolyn (OPTICROM) 4 % ophthalmic solution Place 1 drop into both eyes 4 (four) times daily as needed. 10 mL 5   cyclobenzaprine (FLEXERIL) 10 MG tablet Take 1 tablet (10 mg total) by mouth 3 (three) times daily as needed for muscle spasms. (Patient not taking: Reported on 09/01/2022) 30 tablet 0   cyproheptadine (PERIACTIN) 4 MG tablet Take 1 tablet (4 mg total) by mouth at bedtime. Take a half a tablet or a whole tablet. 30 tablet 5   EPINEPHrine 0.3 mg/0.3 mL IJ SOAJ injection Inject 0.3 mg into the muscle as needed for anaphylaxis. 2 each 1   escitalopram (LEXAPRO) 20 MG tablet Take 1 tablet (20 mg total) by mouth daily. 30 tablet 2   gabapentin (NEURONTIN) 300 MG capsule Take 1 capsule (300 mg total) by mouth 3 (three) times daily. 90 capsule 3   Olopatadine HCl (PATADAY) 0.2 % SOLN Place 1 drop into both eyes 1 day or 1 dose. 2.5 mL 5   prazosin (MINIPRESS) 1 MG capsule Take 1 capsule (1 mg total) by mouth at bedtime. (Patient not taking: Reported on 11/15/2022) 30 capsule 2   RESTASIS 0.05 % ophthalmic emulsion Place 1 drop into both eyes 2 (two) times daily. 0.4 mL 2   traZODone (DESYREL) 50 MG tablet Take 1  tablet (50 mg total) by mouth at bedtime as needed for sleep. 30 tablet 2   triamcinolone (NASACORT) 55 MCG/ACT AERO nasal inhaler Place 2 sprays into the nose daily. 16.5 g 5  No current facility-administered medications for this visit.     Musculoskeletal: Strength & Muscle Tone: {desc; muscle tone:32375} Gait & Station: {PE GAIT ED [id number] Patient leans: {Patient Leans:21022755}  Psychiatric Specialty Exam: Review of Systems  There were no vitals taken for this visit.There is no height or weight on file to calculate BMI.  General Appearance: {Appearance:22683}  Eye Contact:  {BHH EYE CONTACT:22684}  Speech:  {Speech:22685}  Volume:  {Volume (PAA):22686}  Mood:  {BHH MOOD:22306}  Affect:  {Affect (PAA):22687}  Thought Process:  {Thought Process (PAA):22688}  Orientation:  {BHH ORIENTATION (PAA):22689}  Thought Content: {Thought Content:22690}   Suicidal Thoughts:  {ST/HT (PAA):22692}  Homicidal Thoughts:  {ST/HT (PAA):22692}  Memory:  {BHH MEMORY:22881}  Judgement:  {Judgement (PAA):22694}  Insight:  {Insight (PAA):22695}  Psychomotor Activity:  {Psychomotor (PAA):22696}  Concentration:  {Concentration:21399}  Recall:  {BHH GOOD/FAIR/POOR:22877}  Fund of Knowledge: {BHH GOOD/FAIR/POOR:22877}  Language: {BHH GOOD/FAIR/POOR:22877}  Akathisia:  {BHH YES OR NO:22294}  Handed:  {Handed:22697}  AIMS (if indicated): {Desc; done/not:10129}  Assets:  {Assets (PAA):22698}  ADL's:  {BHH XO:4411959  Cognition: {chl bhh cognition:304700322}  Sleep:  {BHH GOOD/FAIR/POOR:22877}   Screenings: PHQ2-9    Flowsheet Row Office Visit from 06/15/2022 in [place] Office Visit from 01/11/2022 in [place] Office Visit from 05/13/2021 in [place] Office Visit from 02/24/2020 in [place] Office Visit from 04/05/2018 in [place]  PHQ-2 Total Score 2 6 1 2 1   PHQ-9 Total Score 4 9  12 4  --      Flowsheet Row ED from 05/13/2021 in [facility] Video Visit from 03/28/2021 in [facility] Video Visit from 12/27/2020 in [facility]  C-SSRS RISK CATEGORY No Risk No Risk No Risk        Assessment and Plan: ***  Collaboration of Care: Collaboration of Care: [facility] OP Collaboration of [id number]  Patient/Guardian was advised Release of Information must be obtained prior to any record release in order to collaborate their care with an outside provider. Patient/Guardian was advised if they have not already done so to contact the registration department to sign all necessary forms in order for [place] to release information regarding their care.   Consent: Patient/Guardian gives verbal consent for treatment and assignment of benefits for services provided during this visit. Patient/Guardian expressed understanding and agreed to proceed.    Freida Busman, MD 11/22/2022, 10:16 AM

## 2022-11-29 ENCOUNTER — Encounter (HOSPITAL_COMMUNITY): Payer: Self-pay | Admitting: Student in an Organized Health Care Education/Training Program

## 2022-11-29 ENCOUNTER — Telehealth (HOSPITAL_BASED_OUTPATIENT_CLINIC_OR_DEPARTMENT_OTHER): Payer: Medicaid Other | Admitting: Student in an Organized Health Care Education/Training Program

## 2022-11-29 DIAGNOSIS — F331 Major depressive disorder, recurrent, moderate: Secondary | ICD-10-CM | POA: Diagnosis not present

## 2022-11-29 DIAGNOSIS — F17200 Nicotine dependence, unspecified, uncomplicated: Secondary | ICD-10-CM

## 2022-11-29 DIAGNOSIS — F4310 Post-traumatic stress disorder, unspecified: Secondary | ICD-10-CM

## 2022-11-29 MED ORDER — PRAZOSIN HCL 2 MG PO CAPS: 2.0000 mg | ORAL_CAPSULE | Freq: Every day | ORAL | 1 refills | Status: DC

## 2022-11-29 MED ORDER — NICOTINE 14 MG/24HR TD PT24: 14.0000 mg | MEDICATED_PATCH | Freq: Every day | TRANSDERMAL | 0 refills | Status: AC

## 2022-11-29 MED ORDER — BUPROPION HCL ER (XL) 150 MG PO TB24: 150.0000 mg | ORAL_TABLET | ORAL | 2 refills | Status: DC

## 2022-11-29 MED ORDER — TRAZODONE HCL 50 MG PO TABS: 50.0000 mg | ORAL_TABLET | Freq: Every evening | ORAL | 2 refills | Status: DC | PRN

## 2022-11-29 MED ORDER — ESCITALOPRAM OXALATE 20 MG PO TABS: 20.0000 mg | ORAL_TABLET | Freq: Every day | ORAL | 2 refills | Status: DC

## 2022-11-29 NOTE — Progress Notes (Signed)
BH MD/PA/NP OP Progress Note  11/29/2022 5:32 PM Haley Reyes  MRN:  [id number]  Chief Complaint:  Chief Complaint  Patient presents with   Follow-up  Virtual Visit via Video Note  I connected with Haley Reyes on 11/29/22 at  3:30 PM EDT by a video enabled telemedicine application and verified that I am speaking with the correct person using two identifiers.  Location: Patient: Home Provider: Office   I discussed the limitations of evaluation and management by telemedicine and the availability of in person appointments. The patient expressed understanding and agreed to proceed.  History of Present Illness:  Haley Reyes is a 47 yo patient w/ a PPH of Chornic PTSD, MDD, and anxiety w/ panic attacks. Current medication regimen:  Lexparo 20mg  daily Trazodone 50mg  QHS Prazosin 1mg  QHS   Patient reports that she is compliant.  Patient reports that she is very tired today. She reports that she has a dx of fibromyalgia and this is likely the issue. Patient reports that she has not started taking PO supplements since her B12 was low but she has been taking Vit D. She reports that she has not had money to buy PO B12.   Patient reports that her sleep fluctuates. Patient reports some nights she can sleep and others she can't. She reports that she is still having nightmares she denies constantly waking up dizzy. Patient reports that her mood the last 2 weeks has been 5/10, she reports that to be an 8/10 she would need some "financial changes."   Patient reports that she has bene stressed by keeping up with finances and not being able to do certain activities that she used to be able to do. Patient reports that he energy level has been lower and this is part of what makes it difficult to complete task even doing chores around the home. Patient endorses that she struggles with low motivation more physically but occasionally she has this mentally.   Patient reports that she still has  hypervigilance and this significantly contributes to her feeling physically exhausted. Patient reports that she has also had avoidance of places and people and lower interest overall. Patient reports that her appetite is good and she is snacking. Patient reports that she loves to eat. Patient reports that her concentration is poor. She reports that she is still on SSI. She reports that she feels like her memory is more the issue and does not endorse much to focus on other than day- to day activities like reading. Patient denies SI, HI, and AVH.  THC- denies Etoh- sparingly No vaping Cigs- can smoke 0.5ppd, wants to think about but wants a rx at pharmacy   I discussed the assessment and treatment plan with the patient. The patient was provided an opportunity to ask questions and all were answered. The patient agreed with the plan and demonstrated an understanding of the instructions.   The patient was advised to call back or seek an in-person evaluation if the symptoms worsen or if the condition fails to improve as anticipated.  I provided 25 minutes of non-face-to-face time during this encounter.   Freida Busman, MD  Visit Diagnosis:    ICD-10-CM   1. PTSD (post-traumatic stress disorder)  F43.10 traZODone (DESYREL) 50 MG tablet    prazosin (MINIPRESS) 2 MG capsule    2. MDD (major depressive disorder), recurrent episode, moderate (HCC)  F33.1 escitalopram (LEXAPRO) 20 MG tablet    buPROPion (WELLBUTRIN XL) 150 MG 24 hr tablet  3. Tobacco use disorder  F17.200 nicotine (NICODERM CQ - DOSED IN MG/24 HOURS) 14 mg/24hr patch      Past Psychiatric History:  INPT: Denies OPT: Previous Dr. Adele Schilder Therapy: yes has one she can get in with PRN Meds: Cymbalta (significant weight loss and hair loss in 1 mon),             Possible other meds: Geodon, Prozac, Zoloft, and depakote,   She states that all her providers have given her are "pain" medications including hydrocodone (which she  couldn't tolerate). She has taken wellbutrin, celexa, cymbalta,  viibryd, and perhaps some stimulants as well per EMR  She notes worsening problems with her memory over the last year or two. From a sleep standpoint, she has ups and downs. she has been seen by pulmonology for insomnia. Her pain often awakens her at night.    Last Visit: 10/2022- Established care. Patient did screen positive for PTSD and was having significant nightmares and hypervigilance as well as avoidance. Continued Lexparo 20mg  but also started prazosin 1mg  QHS. Patient was very guarded throughout assessment.  Past Medical History:  Past Medical History:  Diagnosis Date   Angina at rest    Anxiety    Arthritis    Fibromyalgia    Insomnia    Neuropathy    PTSD (post-traumatic stress disorder)     Past Surgical History:  Procedure Laterality Date   CHOLECYSTECTOMY  2000   cystic fibroids     TUBAL LIGATION      Family Psychiatric History: Son: During his childhood dx w/ ADHD and ODD, took Abilify Schizoaffective bipolar type: Maternal Aunt Bipolar: Brother and cousin Maternal GMA: Anxiety, took meds for this until death   Family History:  Family History  Problem Relation Age of Onset   Lung cancer Father    Hypertension Father    COPD Maternal Grandmother    Congestive Heart Failure Maternal Grandmother    Allergies Other        children x 4   Asthma Other        children x 4   Diabetes Other        fathers siblings   Allergies Brother    Congestive Heart Failure Other        aunt   Sleep apnea Brother    Hypertension Mother     Social History:  Social History   Socioeconomic History   Marital status: Single    Spouse name: Not on file   Number of children: 4   Years of education: Not on file   Highest education level: Not on file  Occupational History   Occupation: unemployed    Employer: NOT EMPLOYED  Tobacco Use   Smoking status: Former    Packs/day: .5    Types: Cigarettes     Start date: 09/04/1998    Quit date: 05/06/2021    Years since quitting: 1.5   Smokeless tobacco: Former  Scientific laboratory technician Use: Never used  Substance and Sexual Activity   Alcohol use: Yes    Comment: glass of red wine occassionally during the week. None at the time d/t new medications.    Drug use: No   Sexual activity: Not Currently  Other Topics Concern   Not on file  Social History Narrative   Are you right handed or left handed? left   Are you currently employed ? no   What is your current occupation?disability   Do you live at  home alone?no   Who lives with you? daughter   What type of home do you live in: 1 story or 2 story? one    Caffeine none   Social Determinants of Health   Financial Resource Strain: Medium Risk (09/12/2018)   Overall Financial Resource Strain (CARDIA)    Difficulty of Paying Living Expenses: Somewhat hard  Food Insecurity: No Food Insecurity (09/12/2018)   Hunger Vital Sign    Worried About Running Out of Food in the Last Year: Never true    Ran Out of Food in the Last Year: Never true  Transportation Needs: No Transportation Needs (09/12/2018)   PRAPARE - Hydrologist (Medical): No    Lack of Transportation (Non-Medical): No  Physical Activity: Inactive (09/12/2018)   Exercise Vital Sign    Days of Exercise per Week: 0 days    Minutes of Exercise per Session: 0 min  Stress: Stress Concern Present (09/12/2018)   [place]    Feeling of Stress : Rather much  Social Connections: Unknown (09/12/2018)   Social Connection and Isolation Panel [NHANES]    Frequency of Communication with Friends and Family: Twice a week    Frequency of Social Gatherings with Friends and Family: Once a week    Attends Religious Services: Never    Marine scientist or Organizations: No    Attends Archivist Meetings: Never    Marital Status: Not on file    Allergies:   Allergies  Allergen Reactions   Shrimp [Shellfish Allergy]    Wheat    Benadryl [Diphenhydramine Hcl]     unknown   Codeine Other (See Comments)    Sweating real bad felt like she was going to pass out    Watermelon Flavor Itching    SOB   Hydrocodone-Ibuprofen Palpitations   Latex Itching and Rash   Other Nausea And Vomiting and Anxiety    *Caffine*    Metabolic Disorder Labs: Lab Results  Component Value Date   HGBA1C 5.4 11/15/2022   No results found for: "PROLACTIN" Lab Results  Component Value Date   CHOL 166 06/15/2022   TRIG 59 06/15/2022   HDL 56 06/15/2022   CHOLHDL 3.0 06/15/2022   LDLCALC 98 06/15/2022   LDLCALC 69 05/13/2021   Lab Results  Component Value Date   TSH 0.799 06/15/2022   TSH 0.758 02/24/2020    Therapeutic Level Labs: No results found for: "LITHIUM" No results found for: "VALPROATE" No results found for: "CBMZ"  Current Medications: Current Outpatient Medications  Medication Sig Dispense Refill   buPROPion (WELLBUTRIN XL) 150 MG 24 hr tablet Take 1 tablet (150 mg total) by mouth every morning. 30 tablet 2   nicotine (NICODERM CQ - DOSED IN MG/24 HOURS) 14 mg/24hr patch Place 1 patch (14 mg total) onto the skin daily. 28 patch 0   cetirizine (ZYRTEC) 10 MG tablet Take 1 tablet (10 mg total) by mouth daily. 30 tablet 11   cromolyn (OPTICROM) 4 % ophthalmic solution Place 1 drop into both eyes 4 (four) times daily as needed. 10 mL 5   cyclobenzaprine (FLEXERIL) 10 MG tablet Take 1 tablet (10 mg total) by mouth 3 (three) times daily as needed for muscle spasms. (Patient not taking: Reported on 09/01/2022) 30 tablet 0   cyproheptadine (PERIACTIN) 4 MG tablet Take 1 tablet (4 mg total) by mouth at bedtime. Take a half a tablet or a whole  tablet. 30 tablet 5   EPINEPHrine 0.3 mg/0.3 mL IJ SOAJ injection Inject 0.3 mg into the muscle as needed for anaphylaxis. 2 each 1   escitalopram (LEXAPRO) 20 MG tablet Take 1 tablet (20 mg total) by mouth  daily. 30 tablet 2   gabapentin (NEURONTIN) 300 MG capsule Take 1 capsule (300 mg total) by mouth 3 (three) times daily. 90 capsule 3   Olopatadine HCl (PATADAY) 0.2 % SOLN Place 1 drop into both eyes 1 day or 1 dose. 2.5 mL 5   prazosin (MINIPRESS) 2 MG capsule Take 1 capsule (2 mg total) by mouth at bedtime. 90 capsule 1   RESTASIS 0.05 % ophthalmic emulsion Place 1 drop into both eyes 2 (two) times daily. 0.4 mL 2   traZODone (DESYREL) 50 MG tablet Take 1 tablet (50 mg total) by mouth at bedtime as needed for sleep. 30 tablet 2   triamcinolone (NASACORT) 55 MCG/ACT AERO nasal inhaler Place 2 sprays into the nose daily. 16.5 g 5   No current facility-administered medications for this visit.      Psychiatric Specialty Exam: Review of Systems  Psychiatric/Behavioral:  Positive for decreased concentration, dysphoric mood and sleep disturbance. Negative for hallucinations and suicidal ideas. The patient is not nervous/anxious.     There were no vitals taken for this visit.There is no height or weight on file to calculate BMI.  General Appearance: Casual  Eye Contact:  Fair  Speech:  Clear and Coherent  Volume:  Normal  Mood:  Euthymic  Affect:  Congruent less guarded but still there  Thought Process:  Goal Directed  Orientation:  Full (Time, Place, and Person)  Thought Content: Logical   Suicidal Thoughts:  No  Homicidal Thoughts:  No  Memory:  Immediate;   Good Recent;   Fair  Judgement:  Fair- improving   Insight:  Shallow  Psychomotor Activity:  Psychomotor Retardation  Concentration:  Concentration: Poor  Recall:  NA  Fund of Knowledge: Fair  Language: Good  Akathisia:  NA  Handed:    AIMS (if indicated): not done  Assets:  Desire for Improvement Housing Resilience  ADL's:  Intact  Cognition: not formally assessed  Sleep:  Fair   Screenings: PHQ2-9    [place] Office Visit from 06/15/2022 in [place] Office Visit from 01/11/2022 in [place] Office Visit from 05/13/2021 in [place] Office Visit from 02/24/2020 in [place] Office Visit from 04/05/2018 in [place]  PHQ-2 Total Score 2 6 1 2 1   PHQ-9 Total Score 4 9 12 4  --      Flowsheet Row ED from 05/13/2021 in [facility] Video Visit from 03/28/2021 in [facility] Video Visit from 12/27/2020 in [place] No Risk No Risk No Risk        Assessment and Plan: Haley Reyes is a 47 yo patient w/ a PPH of Chornic PTSD, MDD, and anxiety w/ panic attacks.  Patinet endorses feeling fatigued today. She did endorse that she wanted to talk about some things at next appt then later spoke about them, but denies SI and HI. She  openly endorses more avoidant behaviors and appears to be slowly opening up. He symptoms continue to support a primary dx of PTSD contributing to depression and somatization disorders. Because patient continues to endorse a  lot of neurovegetative symptoms will add wellbutrin as an adjunct to lexapro. Patient was interested in patches being made available as well, should she want to start them. Will also increase Prazosin to address continued nightmares and hypervigilance. So far patient is not endorsing symptomatic hypotension.    Chronic PTSD MDD - Continue Lexapro 20mg  daily - Increase Prazosin to 2mg  QHS - Start Wellbutrin XL 150mg  daily   Tobacco use disorder, mild - Nicotine patch 14mg    Recently [place] help [place]. Patient endorses that she is nervous about keeping her physicians once she can be seen by her same doctors.   Collaboration of Care: Collaboration of Care:   Patient/Guardian was advised Release of Information must be obtained prior to any record release in order to collaborate their  care with an outside provider. Patient/Guardian was advised if they have not already done so to contact the registration department to sign all necessary forms in order for [place] to release information regarding their care.   Consent: Patient/Guardian gives verbal consent for treatment and assignment of benefits for services provided during this visit. Patient/Guardian expressed understanding and agreed to proceed.   PGY-3 Freida Busman, MD 11/29/2022, 5:32 PM

## 2022-11-30 ENCOUNTER — Telehealth: Payer: Self-pay | Admitting: Neurology

## 2022-11-30 NOTE — Telephone Encounter (Signed)
Requesting if Haley Reyes can fill out the form for her handicap placard and can she get one that lasts longer than 6 months. Her current one expires today 3/28

## 2022-11-30 NOTE — Addendum Note (Signed)
Addended by: Charlette Caffey on: 11/30/2022 08:49 AM   Modules accepted: Level of Service

## 2022-12-07 ENCOUNTER — Encounter: Payer: Self-pay | Admitting: Nurse Practitioner

## 2022-12-07 ENCOUNTER — Ambulatory Visit (INDEPENDENT_AMBULATORY_CARE_PROVIDER_SITE_OTHER): Payer: Medicaid Other | Admitting: Nurse Practitioner

## 2022-12-07 VITALS — BP 115/80 | HR 81 | Temp 97.7°F | Ht 61.0 in | Wt 130.2 lb

## 2022-12-07 DIAGNOSIS — N898 Other specified noninflammatory disorders of vagina: Secondary | ICD-10-CM | POA: Insufficient documentation

## 2022-12-07 DIAGNOSIS — Z113 Encounter for screening for infections with a predominantly sexual mode of transmission: Secondary | ICD-10-CM

## 2022-12-07 LAB — POCT URINALYSIS DIPSTICK
Bilirubin, UA: NEGATIVE
Glucose, UA: NEGATIVE
Ketones, UA: NEGATIVE
Leukocytes, UA: NEGATIVE
Nitrite, UA: NEGATIVE
Protein, UA: NEGATIVE
Spec Grav, UA: 1.015 (ref 1.010–1.025)
Urobilinogen, UA: 0.2 E.U./dL
pH, UA: 8.5 — AB (ref 5.0–8.0)

## 2022-12-07 NOTE — Assessment & Plan Note (Signed)
-   NuSwab Vaginitis Plus (VG+) - Urinalysis Dipstick - Ambulatory referral to Obstetrics / Gynecology  2. Screen for STD (sexually transmitted disease)  - RPR+HIV+GC+CT Panel  Follow up:  Follow up as needed

## 2022-12-07 NOTE — Patient Instructions (Signed)
1. Vaginal irritation  - NuSwab Vaginitis Plus (VG+) - Urinalysis Dipstick - Ambulatory referral to Obstetrics / Gynecology  2. Screen for STD (sexually transmitted disease)  - RPR+HIV+GC+CT Panel  Follow up:  Follow up as needed

## 2022-12-07 NOTE — Progress Notes (Signed)
@Patient  ID: Haley Reyes, female    DOB: 1975-10-26, 47 y.o.   MRN: [id number]  Chief Complaint  Patient presents with   Vaginal Discharge   Vaginal Pain    Pt is experiencing vaginal discomfort for over a month on and off    Referring provider: Shellia Carwin, MD   HPI  Patient presents today for acute visit.  Patient states that she has been having vaginal discharge and irritation for the past few days.  She would like STD testing.  We will order new swab and serum STD testing.  UA in office was overall clear other than trace blood. Denies f/c/s, n/v/d, hemoptysis, PND, leg swelling Denies chest pain or edema       Allergies  Allergen Reactions   Shrimp [Shellfish Allergy]    Wheat    Bee Pollen    Benadryl [Diphenhydramine Hcl]     unknown   Chicken Allergy    Codeine Other (See Comments)    Sweating real bad felt like she was going to pass out    Watermelon Flavor Itching    SOB   Hydrocodone-Ibuprofen Palpitations   Latex Itching and Rash   Other Nausea And Vomiting and Anxiety    *Caffine*    There is no immunization history for the selected administration types on file for this patient.  Past Medical History:  Diagnosis Date   Angina at rest    Anxiety    Arthritis    Fibromyalgia    Insomnia    Neuropathy    PTSD (post-traumatic stress disorder)     Tobacco History: Social History   Tobacco Use  Smoking Status Former   Packs/day: .5   Types: Cigarettes   Start date: 09/04/1998   Quit date: 05/06/2021   Years since quitting: 1.5  Smokeless Tobacco Former   Counseling given: Not Answered   Outpatient Encounter Medications as of 12/07/2022  Medication Sig   buPROPion (WELLBUTRIN XL) 150 MG 24 hr tablet Take 1 tablet (150 mg total) by mouth every morning.   cetirizine (ZYRTEC) 10 MG tablet Take 1 tablet (10 mg total) by mouth daily.   cromolyn (OPTICROM) 4 % ophthalmic solution Place 1 drop into both eyes 4 (four) times daily as needed.    cyclobenzaprine (FLEXERIL) 10 MG tablet Take 1 tablet (10 mg total) by mouth 3 (three) times daily as needed for muscle spasms.   cyproheptadine (PERIACTIN) 4 MG tablet Take 1 tablet (4 mg total) by mouth at bedtime. Take a half a tablet or a whole tablet.   EPINEPHrine 0.3 mg/0.3 mL IJ SOAJ injection Inject 0.3 mg into the muscle as needed for anaphylaxis.   escitalopram (LEXAPRO) 20 MG tablet Take 1 tablet (20 mg total) by mouth daily.   gabapentin (NEURONTIN) 300 MG capsule Take 1 capsule (300 mg total) by mouth 3 (three) times daily.   Olopatadine HCl (PATADAY) 0.2 % SOLN Place 1 drop into both eyes 1 day or 1 dose.   prazosin (MINIPRESS) 2 MG capsule Take 1 capsule (2 mg total) by mouth at bedtime.   RESTASIS 0.05 % ophthalmic emulsion Place 1 drop into both eyes 2 (two) times daily.   traZODone (DESYREL) 50 MG tablet Take 1 tablet (50 mg total) by mouth at bedtime as needed for sleep.   nicotine (NICODERM CQ - DOSED IN MG/24 HOURS) 14 mg/24hr patch Place 1 patch (14 mg total) onto the skin daily. (Patient not taking: Reported on 12/07/2022)   triamcinolone (  NASACORT) 55 MCG/ACT AERO nasal inhaler Place 2 sprays into the nose daily. (Patient not taking: Reported on 12/07/2022)   No facility-administered encounter medications on file as of 12/07/2022.     Review of Systems  Review of Systems  Constitutional: Negative.   HENT: Negative.    Cardiovascular: Negative.   Gastrointestinal: Negative.   Allergic/Immunologic: Negative.   Neurological: Negative.   Psychiatric/Behavioral: Negative.         Physical Exam  BP 115/80   Pulse 81   Temp 97.7 F (36.5 C)   Ht 5\' 1"  (1.549 m)   Wt 130 lb 3.2 oz (59.1 kg)   LMP 11/27/2022 (Approximate)   SpO2 100%   BMI 24.60 kg/m   Wt Readings from Last 5 Encounters:  12/07/22 130 lb 3.2 oz (59.1 kg)  11/15/22 129 lb (58.5 kg)  06/15/22 141 lb (64 kg)  11/15/21 130 lb (59 kg)  10/14/21 131 lb 2 oz (59.5 kg)     Physical Exam Vitals  and nursing note reviewed.  Constitutional:      General: She is not in acute distress.    Appearance: She is well-developed.  Cardiovascular:     Rate and Rhythm: Normal rate and regular rhythm.  Pulmonary:     Effort: Pulmonary effort is normal.     Breath sounds: Normal breath sounds.  Neurological:     Mental Status: She is alert and oriented to person, place, and time.      Lab Results:  CBC    Component Value Date/Time   WBC 5.5 06/15/2022 0944   WBC 5.4 08/01/2017 1013   RBC 4.21 06/15/2022 0944   RBC 4.50 08/01/2017 1013   HGB 12.8 06/15/2022 0944   HCT 38.1 06/15/2022 0944   PLT 287 06/15/2022 0944   MCV 91 06/15/2022 0944   MCH 30.4 06/15/2022 0944   MCH 29.8 08/01/2017 1013   MCHC 33.6 06/15/2022 0944   MCHC 33.6 08/01/2017 1013   RDW 12.0 06/15/2022 0944   LYMPHSABS 2.1 05/13/2021 1114   MONOABS 0.8 11/30/2007 2119   EOSABS 0.1 05/13/2021 1114   BASOSABS 0.0 05/13/2021 1114    BMET    Component Value Date/Time   NA 140 06/15/2022 0944   K 3.5 06/15/2022 0944   CL 104 06/15/2022 0944   CO2 22 06/15/2022 0944   GLUCOSE 86 06/15/2022 0944   GLUCOSE 89 07/20/2015 1716   BUN 8 06/15/2022 0944   CREATININE 0.73 06/15/2022 0944   CALCIUM 8.9 06/15/2022 0944   GFRNONAA 93 02/24/2020 1208   GFRAA 108 02/24/2020 1208     Assessment & Plan:   Vaginal irritation - NuSwab Vaginitis Plus (VG+) - Urinalysis Dipstick - Ambulatory referral to Obstetrics / Gynecology  2. Screen for STD (sexually transmitted disease)  - RPR+HIV+GC+CT Panel  Follow up:  Follow up as needed     [name], NP 12/07/2022

## 2022-12-08 DIAGNOSIS — R3981 Functional urinary incontinence: Secondary | ICD-10-CM | POA: Diagnosis not present

## 2022-12-08 DIAGNOSIS — R32 Unspecified urinary incontinence: Secondary | ICD-10-CM | POA: Diagnosis not present

## 2022-12-10 LAB — NUSWAB VAGINITIS PLUS (VG+)
Atopobium vaginae: HIGH Score — AB
BVAB 2: HIGH Score — AB
Candida albicans, NAA: NEGATIVE
Candida glabrata, NAA: NEGATIVE
Chlamydia trachomatis, NAA: NEGATIVE
Megasphaera 1: HIGH Score — AB
Neisseria gonorrhoeae, NAA: NEGATIVE
Trich vag by NAA: NEGATIVE

## 2022-12-10 LAB — RPR+HIV+GC+CT PANEL
Chlamydia trachomatis, NAA: NEGATIVE
HIV Screen 4th Generation wRfx: NONREACTIVE
Neisseria Gonorrhoeae by PCR: NEGATIVE
RPR Ser Ql: NONREACTIVE

## 2022-12-11 ENCOUNTER — Telehealth: Payer: Self-pay | Admitting: Nurse Practitioner

## 2022-12-11 ENCOUNTER — Encounter: Payer: Medicaid Other | Admitting: Neurology

## 2022-12-11 ENCOUNTER — Telehealth: Payer: Self-pay

## 2022-12-11 ENCOUNTER — Other Ambulatory Visit: Payer: Self-pay | Admitting: Nurse Practitioner

## 2022-12-11 MED ORDER — METRONIDAZOLE 1 % EX GEL: Freq: Every day | CUTANEOUS | 0 refills | Status: AC

## 2022-12-11 MED ORDER — METRONIDAZOLE 500 MG PO TABS: 500.0000 mg | ORAL_TABLET | Freq: Two times a day (BID) | ORAL | 0 refills | Status: DC

## 2022-12-11 NOTE — Telephone Encounter (Signed)
Handicap parking form completed and given to Ruben Im, RMA. Please notify patient.

## 2022-12-11 NOTE — Telephone Encounter (Signed)
Pt was advised and a copy was made for the pt chart. Kh

## 2022-12-12 NOTE — Telephone Encounter (Signed)
Done

## 2023-01-11 ENCOUNTER — Encounter (HOSPITAL_COMMUNITY): Payer: Self-pay | Admitting: Student in an Organized Health Care Education/Training Program

## 2023-01-11 ENCOUNTER — Telehealth (HOSPITAL_BASED_OUTPATIENT_CLINIC_OR_DEPARTMENT_OTHER): Payer: Medicaid Other | Admitting: Student in an Organized Health Care Education/Training Program

## 2023-01-11 DIAGNOSIS — F331 Major depressive disorder, recurrent, moderate: Secondary | ICD-10-CM

## 2023-01-11 DIAGNOSIS — F4310 Post-traumatic stress disorder, unspecified: Secondary | ICD-10-CM

## 2023-01-11 MED ORDER — PRAZOSIN HCL 2 MG PO CAPS
2.0000 mg | ORAL_CAPSULE | Freq: Every day | ORAL | 1 refills | Status: DC
Start: 2023-01-11 — End: 2023-02-15

## 2023-01-11 MED ORDER — TRAZODONE HCL 50 MG PO TABS
50.0000 mg | ORAL_TABLET | Freq: Every evening | ORAL | 2 refills | Status: DC | PRN
Start: 2023-01-11 — End: 2023-02-15

## 2023-01-11 MED ORDER — ESCITALOPRAM OXALATE 20 MG PO TABS: 20.0000 mg | ORAL_TABLET | Freq: Every day | ORAL | 2 refills | Status: DC

## 2023-01-11 MED ORDER — BUPROPION HCL ER (XL) 300 MG PO TB24: 300.0000 mg | ORAL_TABLET | ORAL | 2 refills | Status: DC

## 2023-01-11 NOTE — Progress Notes (Signed)
Virtual Visit via Video Note  I connected with Haley Reyes on 01/11/23 at  3:00 PM EDT by a video enabled telemedicine application and verified that I am speaking with the correct person using two identifiers.  Location: Patient: [facility] [place] Provider: Office   I discussed the limitations of evaluation and management by telemedicine and the availability of in person appointments. The patient expressed understanding and agreed to proceed.    I discussed the assessment and treatment plan with the patient. The patient was provided an opportunity to ask questions and all were answered. The patient agreed with the plan and demonstrated an understanding of the instructions.   The patient was advised to call back or seek an in-person evaluation if the symptoms worsen or if the condition fails to improve as anticipated.  I provided 30 minutes of non-face-to-face time during this encounter.   Haley Morton, MD  [facility] MD/PA/NP OP Progress Note  01/11/2023 3:42 PM Haley Reyes  MRN:  [id number]  Chief Complaint:  Chief Complaint  Patient presents with   Follow-up   HPI: Haley Reyes is a 47 yo patient w/ a PPH of [place] PTSD, MDD, and anxiety w/ panic attacks. Current medication regimen:   Lexparo 20mg  daily Trazodone 50mg  QHS PRN Prazosin 2mg  QHS Nicotine patch 14mg  (decided not to use) Wellbutrin XL 150mg  daily   Patient reports that she would describe her mood as "low." Patient unfortunately was notified on day of last appt she had 30 days to move out, due to the owner wanted to change how they used their property. Unless otherwise noted she was compliant with the above medications. Patient reports that she is not sleeping well, but right now she doesn't want to. Patient reports that she has night time awakenings, and has hypervigilance. Patient reports that she because she is not in her own home she is not sure she really wants to sleep peacefully. Patient reports that she does  not feel unsafe in the home, but alludes to some issues in the neighborhood and some test things in the home that have led to her not feeling like she can or should relax. Patient endorses if she were in her own space and had the same symptoms she would take Trazodone nightly.   Patient reports that she still has some anxiety and struggles with feeling in control. Patient reports more hyperarousal and hypervigilance when asked about details for her anxiety. She reports that she is triggered by noises. She is also triggered by certain conversations and will become avoidant or guarded in these moments.   Patient reports that she still has weeks where she will feel down, endorses psychomotor retardation and low motivations. Patient reports she will become dysphoric as a result. Patient reports that she struggles and has to force herself to not just lie around the home. Patient reports that she is aware that if she stays lying down, she will only continue to feel worse. Despite this she has noticed that she does have some increase in energy with Wellbutrin. She denies cognitive retardation.  Patient interested in Bipolar disorder, after going over symptoms it did not appear patient has ever had a true manic episode but does have hypervigilance and anxiety in general.  Patient denies SI, HI, and AVH.   THC- none Etoh- doesn't have a gallbladder so does not drink excessively, but endorses she drank more the week she moved. Endorses less than 1 glass of wine/day.  Visit Diagnosis:  ICD-10-CM   1. MDD (major depressive disorder), recurrent episode, moderate (HCC)  F33.1 buPROPion (WELLBUTRIN XL) 300 MG 24 hr tablet    escitalopram (LEXAPRO) 20 MG tablet    2. PTSD (Reyes-traumatic stress disorder)  F43.10 prazosin (MINIPRESS) 2 MG capsule    traZODone (DESYREL) 50 MG tablet      Past Psychiatric History:  INPT: Denies OPT: Previous Dr. Lolly Mustache Therapy: yes has one she can get in with PRN Meds:  Cymbalta (significant weight loss and hair loss in 1 mon),             Possible other meds: Geodon, Prozac, Zoloft, and depakote,   She states that all her providers have given her are "pain" medications including hydrocodone (which she couldn't tolerate). She has taken wellbutrin, celexa, cymbalta,  viibryd, and perhaps some stimulants as well per EMR  She notes worsening problems with her memory over the last year or two. From a sleep standpoint, she has ups and downs. she has been seen by pulmonology for insomnia. Her pain often awakens her at night.    Last Visit: 10/2022- Established care. Patient did screen positive for PTSD and was having significant nightmares and hypervigilance as well as avoidance. Continued Lexparo 20mg  but also started prazosin 1mg  QHS. Patient was very guarded throughout assessment.   11/2022- Slowly became less guarded over the course of appt. Endorsed avoidant behaviors related to trauma and continued neurovegetative symptoms. Prazosin was increased to address nightmares and hypervigilance and Wellbutrin XL 150mg  was started to address continued neurovegetative symptoms. Continue Lexapro 20mg  daily.   Past Medical History:  Past Medical History:  Diagnosis Date   Angina at rest    Anxiety    Arthritis    Fibromyalgia    Insomnia    Neuropathy    PTSD (Reyes-traumatic stress disorder)     Past Surgical History:  Procedure Laterality Date   CHOLECYSTECTOMY  2000   cystic fibroids     TUBAL LIGATION      Family Psychiatric History:  Son: During his childhood dx w/ ADHD and ODD, took Abilify Schizoaffective bipolar type: Maternal Aunt Bipolar: Brother and cousin Maternal GMA: Anxiety, took meds for this until death    Family History:  Family History  Problem Relation Age of Onset   Lung cancer Father    Hypertension Father    COPD Maternal Grandmother    Congestive Heart Failure Maternal Grandmother    Allergies Other        children x 4   Asthma  Other        children x 4   Diabetes Other        fathers siblings   Allergies Brother    Congestive Heart Failure Other        aunt   Sleep apnea Brother    Hypertension Mother     Social History:  Social History   Socioeconomic History   Marital status: Single    Spouse name: Not on file   Number of children: 4   Years of education: Not on file   Highest education level: Not on file  Occupational History   Occupation: unemployed    Employer: NOT EMPLOYED  Tobacco Use   Smoking status: Former    Packs/day: .5    Types: Cigarettes    Start date: 09/04/1998    Quit date: 05/06/2021    Years since quitting: 1.6   Smokeless tobacco: Former  Building services engineer Use: Never used  Substance and Sexual Activity   Alcohol use: Yes    Comment: glass of red wine occassionally during the week. None at the time d/t new medications.    Drug use: No   Sexual activity: Not Currently  Other Topics Concern   Not on file  Social History Narrative   Are you right handed or left handed? left   Are you currently employed ? no   What is your current occupation?disability   Do you live at home alone?no   Who lives with you? daughter   What type of home do you live in: 1 story or 2 story? one    Caffeine none   Social Determinants of Health   Financial Resource Strain: Medium Risk (09/12/2018)   Overall Financial Resource Strain (CARDIA)    Difficulty of Paying Living Expenses: Somewhat hard  Food Insecurity: No Food Insecurity (09/12/2018)   Hunger Vital Sign    Worried About Running Out of Food in the Last Year: Never true    Ran Out of Food in the Last Year: Never true  Transportation Needs: No Transportation Needs (09/12/2018)   PRAPARE - Administrator, Civil Service (Medical): No    Lack of Transportation (Non-Medical): No  Physical Activity: Inactive (09/12/2018)   Exercise Vital Sign    Days of Exercise per Week: 0 days    Minutes of Exercise per Session: 0 min   Stress: Stress Concern Present (09/12/2018)   [employer] of Occupational Health - Occupational Stress Questionnaire    Feeling of Stress : Rather much  Social Connections: Unknown (09/12/2018)   Social Connection and Isolation Panel [NHANES]    Frequency of Communication with Friends and Family: Twice a week    Frequency of Social Gatherings with Friends and Family: Once a week    Attends Religious Services: Never    Database administrator or Organizations: No    Attends Banker Meetings: Never    Marital Status: Not on file    Allergies:  Allergies  Allergen Reactions   Shrimp [Shellfish Allergy]    Wheat    Bee Pollen    Benadryl [Diphenhydramine Hcl]     unknown   Chicken Allergy    Codeine Other (See Comments)    Sweating real bad felt like she was going to pass out    Watermelon Flavor Itching    SOB   Hydrocodone-Ibuprofen Palpitations   Latex Itching and Rash   Other Nausea And Vomiting and Anxiety    *Caffine*    Metabolic Disorder Labs: Lab Results  Component Value Date   HGBA1C 5.4 11/15/2022   No results found for: "PROLACTIN" Lab Results  Component Value Date   CHOL 166 06/15/2022   TRIG 59 06/15/2022   HDL 56 06/15/2022   CHOLHDL 3.0 06/15/2022   LDLCALC 98 06/15/2022   LDLCALC 69 05/13/2021   Lab Results  Component Value Date   TSH 0.799 06/15/2022   TSH 0.758 02/24/2020    Therapeutic Level Labs: No results found for: "LITHIUM" No results found for: "VALPROATE" No results found for: "CBMZ"  Current Medications: Current Outpatient Medications  Medication Sig Dispense Refill   buPROPion (WELLBUTRIN XL) 300 MG 24 hr tablet Take 1 tablet (300 mg total) by mouth every morning. 30 tablet 2   cetirizine (ZYRTEC) 10 MG tablet Take 1 tablet (10 mg total) by mouth daily. 30 tablet 11   cromolyn (OPTICROM) 4 % ophthalmic solution Place 1 drop into both  eyes 4 (four) times daily as needed. 10 mL 5   cyclobenzaprine (FLEXERIL) 10 MG  tablet Take 1 tablet (10 mg total) by mouth 3 (three) times daily as needed for muscle spasms. 30 tablet 0   cyproheptadine (PERIACTIN) 4 MG tablet Take 1 tablet (4 mg total) by mouth at bedtime. Take a half a tablet or a whole tablet. 30 tablet 5   EPINEPHrine 0.3 mg/0.3 mL IJ SOAJ injection Inject 0.3 mg into the muscle as needed for anaphylaxis. 2 each 1   escitalopram (LEXAPRO) 20 MG tablet Take 1 tablet (20 mg total) by mouth daily. 30 tablet 2   gabapentin (NEURONTIN) 300 MG capsule Take 1 capsule (300 mg total) by mouth 3 (three) times daily. 90 capsule 3   nicotine (NICODERM CQ - DOSED IN MG/24 HOURS) 14 mg/24hr patch Place 1 patch (14 mg total) onto the skin daily. (Patient not taking: Reported on 12/07/2022) 28 patch 0   Olopatadine HCl (PATADAY) 0.2 % SOLN Place 1 drop into both eyes 1 day or 1 dose. 2.5 mL 5   prazosin (MINIPRESS) 2 MG capsule Take 1 capsule (2 mg total) by mouth at bedtime. 90 capsule 1   RESTASIS 0.05 % ophthalmic emulsion Place 1 drop into both eyes 2 (two) times daily. 0.4 mL 2   traZODone (DESYREL) 50 MG tablet Take 1 tablet (50 mg total) by mouth at bedtime as needed for sleep. 30 tablet 2   triamcinolone (NASACORT) 55 MCG/ACT AERO nasal inhaler Place 2 sprays into the nose daily. (Patient not taking: Reported on 12/07/2022) 16.5 g 5   No current facility-administered medications for this visit.     Psychiatric Specialty Exam: Review of Systems  Psychiatric/Behavioral:  Positive for sleep disturbance. Negative for hallucinations and suicidal ideas. The patient is nervous/anxious.     There were no vitals taken for this visit.There is no height or weight on file to calculate BMI.  General Appearance: Casual  Eye Contact:  Good  Speech:  Clear and Coherent  Volume:  Normal  Mood:  Dysphoric  Affect:  Congruent and Restricted  Thought Process:  Goal Directed  Orientation:  Full (Time, Place, and Person)  Thought Content: Logical   Suicidal Thoughts:  No   Homicidal Thoughts:  No  Memory:  Immediate;   Good Recent;   Good  Judgement:  Fair  Insight:  Shallow  Psychomotor Activity:  Normal  Concentration:  Concentration: Fair  Recall:  NA  Fund of Knowledge: Fair  Language: Good  Akathisia:  NA  Handed:    AIMS (if indicated): not done  Assets:  Resilience  ADL's:  Intact  Cognition: WNL  Sleep:  Poor   Screenings: PHQ2-9    Flowsheet Row Office Visit from 12/07/2022 in [facility] Office Visit from 06/15/2022 in [facility] Office Visit from 01/11/2022 in [facility] Office Visit from 05/13/2021 in [facility] Office Visit from 02/24/2020 in [facility]  PHQ-2 Total Score 2 2 6 1 2   PHQ-9 Total Score 10 4 9 12 4       Flowsheet Row ED from 05/13/2021 in [facility] Video Visit from [facility]/28/2021 [facility] Video Visit from [facility]  C-SSRS RISK CATEGORY No Risk No Risk No Risk        Assessment and Plan:   Patient did  have a large external event, that has been stressful. Currently patient may benefit from sleeping better, but is actively resistant to this. She continue to be somewhat guarded about things, such as why she feels resistant to getting good rest. But she does become more comfortable over the course of assessment. Patient continues to endorse symptoms of PTSD and GAD, including feeling physically tense, ruminating thoughts, hypervigilance and avoidant behaviors. At this time remain unsure how much of the guarded beahviors are 2/2 to PTSD or personality, but patinet does slowly open up over the course of each assessment and endorses multiple PTSD related symptoms. Will increase Wellbutrin as patient did endorse some benefit.   Safety planning was done with patient should she have sudden  mental health decompensations (given instable home circumstances and this is a huge stressor currently). Patient educated on 911, 59 and [facility]. Patient endorsed appreciation. Also provided information on the [facility] and DSS.   Chronic PTSD MDD - Continue Lexapro 20mg  daily - Continue Prazosin to 2mg  QHS -Increase Wellbutrin XL to 300mg  daily - Continue Trazodone 50mg  QHS PRN   Tobacco use disorder, mild - Not ready to quit currently  F/u in 1 month  Collaboration of Care: Collaboration of Care:   Patient/Guardian was advised Release of Information must be obtained prior to any record release in order to collaborate their care with an outside provider. Patient/Guardian was advised if they have not already done so to contact the registration department to sign all necessary forms in order for [place] to release information regarding their care.   Consent: Patient/Guardian gives verbal consent for treatment and assignment of benefits for services provided during this visit. Patient/Guardian expressed understanding and agreed to proceed.   PGY-3 Haley Morton, MD 01/11/2023, 3:42 PM

## 2023-01-16 DIAGNOSIS — R3981 Functional urinary incontinence: Secondary | ICD-10-CM | POA: Diagnosis not present

## 2023-01-16 DIAGNOSIS — R32 Unspecified urinary incontinence: Secondary | ICD-10-CM | POA: Diagnosis not present

## 2023-01-18 ENCOUNTER — Ambulatory Visit (INDEPENDENT_AMBULATORY_CARE_PROVIDER_SITE_OTHER): Payer: Medicaid Other | Admitting: Family Medicine

## 2023-01-18 ENCOUNTER — Other Ambulatory Visit: Payer: Self-pay

## 2023-01-18 ENCOUNTER — Encounter: Payer: Self-pay | Admitting: Family Medicine

## 2023-01-18 VITALS — BP 124/88 | HR 91 | Temp 98.0°F | Ht 61.0 in | Wt 130.8 lb

## 2023-01-18 DIAGNOSIS — J301 Allergic rhinitis due to pollen: Secondary | ICD-10-CM

## 2023-01-18 DIAGNOSIS — T781XXD Other adverse food reactions, not elsewhere classified, subsequent encounter: Secondary | ICD-10-CM | POA: Diagnosis not present

## 2023-01-18 DIAGNOSIS — R051 Acute cough: Secondary | ICD-10-CM | POA: Diagnosis not present

## 2023-01-18 DIAGNOSIS — H1010 Acute atopic conjunctivitis, unspecified eye: Secondary | ICD-10-CM

## 2023-01-18 DIAGNOSIS — G4720 Circadian rhythm sleep disorder, unspecified type: Secondary | ICD-10-CM

## 2023-01-18 DIAGNOSIS — Z720 Tobacco use: Secondary | ICD-10-CM

## 2023-01-18 DIAGNOSIS — L500 Allergic urticaria: Secondary | ICD-10-CM

## 2023-01-18 DIAGNOSIS — H1013 Acute atopic conjunctivitis, bilateral: Secondary | ICD-10-CM | POA: Diagnosis not present

## 2023-01-18 MED ORDER — VENTOLIN HFA 108 (90 BASE) MCG/ACT IN AERS: 2.0000 | INHALATION_SPRAY | RESPIRATORY_TRACT | 1 refills | Status: DC | PRN

## 2023-01-18 MED ORDER — EPINEPHRINE 0.3 MG/0.3ML IJ SOAJ: 0.3000 mg | INTRAMUSCULAR | 1 refills | Status: AC | PRN

## 2023-01-18 MED ORDER — PATADAY 0.2 % OP SOLN: 1.0000 [drp] | OPHTHALMIC | 5 refills | Status: AC

## 2023-01-18 MED ORDER — LEVOCETIRIZINE DIHYDROCHLORIDE 5 MG PO TABS: 5.0000 mg | ORAL_TABLET | Freq: Every day | ORAL | 1 refills | Status: AC | PRN

## 2023-01-18 MED ORDER — TRIAMCINOLONE ACETONIDE 55 MCG/ACT NA AERO: 2.0000 | INHALATION_SPRAY | Freq: Every day | NASAL | 5 refills | Status: DC

## 2023-01-18 NOTE — Progress Notes (Cosign Needed Addendum)
[address] [place] [address] Dept: [phone]  FOLLOW UP NOTE  Patient ID: Haley Reyes, female    DOB: [birth year]  Age: 47 y.o. MRN: [id number] Date of Office Visit: 01/18/2023  Assessment  Chief Complaint: Cough and Wheezing  HPI Haley Reyes is a 47 year old female who presents to the clinic for follow-up visit.  She was last seen in this clinic on 11/15/2021 by Dr. Lucie Leather for evaluation of allergic rhinitis, allergic conjunctivitis, oral allergy syndrome, food allergy to dairy, wheat, and banana, urticaria, sleep disorder, and tobacco use.  At today's visit, she reports her allergies have been poorly controlled with symptoms including postnasal drainage and sneeze as the main symptoms.  She continues Nasacort as needed and Claritin occasionally.  She demonstrates poor Nasacort application technique in the clinic today.  She is not currently using saline nasal rinses.  She reports her symptoms flare when she goes outside for an extended period of time.  She reports that she has been trying to stay inside and only go outside when necessary over the last month.Her last environmental allergy skin testing was on 06/29/2021 and was positive to grass pollen, weed pollen, tree pollen, and dust mite.  She is mildly interested in allergen immunotherapy at this time.  She reports that over the last 5 days she has developed symptoms including shortness of breath with activity, wheezing in the daytime and nighttime, and dry cough occurring mostly in the day and some at nighttime.  She denies fever, sweats, chills, or sick contacts.  She continues cetirizine for management of breathing symptoms.  She does not have a history of asthma and reports that she has not used an albuterol inhaler previously to today.  Allergic conjunctivitis is reported as moderately well-controlled with occasional red and itchy eyes for which she uses olopatadine with relief of symptoms.  She continues Periactin 2 mg  nightly for sleep disorder.  She denies urticaria since her last visit to this clinic. She continues to avoid wheat, and banana, however, she eats small amounts of cheese and other products containing milk without adverse reaction.  Her EpiPen's are up-to-date.  She continues to smoke about a pack a day and is not interested in quitting at this time.  Her current medications are listed in the chart.  Drug Allergies:  Allergies  Allergen Reactions   Shrimp [Shellfish Allergy]    Wheat    Bee Pollen    Benadryl [Diphenhydramine Hcl]     unknown   Chicken Allergy    Codeine Other (See Comments)    Sweating real bad felt like she was going to pass out    Watermelon Flavor Itching    SOB   Hydrocodone-Ibuprofen Palpitations   Latex Itching and Rash   Other Nausea And Vomiting and Anxiety    *Caffine*    Physical Exam: BP 124/88   Pulse 91   Temp 98 F (36.7 C)   Ht 5\' 1"  (1.549 m)   Wt 130 lb 12.8 oz (59.3 kg)   SpO2 99%   BMI 24.71 kg/m    Physical Exam Vitals reviewed.  Constitutional:      Appearance: Normal appearance.  HENT:     Head: Normocephalic and atraumatic.     Right Ear: Tympanic membrane normal.     Left Ear: Tympanic membrane normal.     Nose:     Comments: Bilateral nares grossly edematous with thick clear nasal drainage noted.  Pharynx slightly erythematous with no  exudate.  Ears normal.  Eyes normal. Eyes:     Conjunctiva/sclera: Conjunctivae normal.  Cardiovascular:     Rate and Rhythm: Normal rate and regular rhythm.     Heart sounds: Normal heart sounds. No murmur heard. Pulmonary:     Effort: Pulmonary effort is normal.     Breath sounds: Normal breath sounds.     Comments: Bilateral rhonchi which cleared with cough.  No wheeze noted Musculoskeletal:        General: Normal range of motion.     Cervical back: Normal range of motion and neck supple.  Skin:    General: Skin is warm and dry.  Neurological:     Mental Status: She is alert  and oriented to person, place, and time.  Psychiatric:        Mood and Affect: Mood normal.        Behavior: Behavior normal.        Thought Content: Thought content normal.        Judgment: Judgment normal.     Diagnostics: FVC 2.04 which is 74% of predicted value, FEV1 1.56 which is 69% of predicted value.  Spirometry indicates possible restriction.  Postbronchodilator FVC 2.08 and FEV1 1.50.  Postbronchodilator spirometry indicates no significant change in FEV1.  Assessment and Plan: 1. Acute cough   2. Adverse food reaction, subsequent encounter   3. Dysfunction of sleep stage or arousal   4. Seasonal allergic conjunctivitis   5. Allergic urticaria   6. Seasonal allergic rhinitis due to pollen     Meds ordered this encounter  Medications   EPINEPHRINE 0.3 mg/0.3 mL IJ SOAJ injection    Sig: Inject 0.3 mg into the muscle as needed for anaphylaxis.    Dispense:  2 each    Refill:  1    Covered by medicaid no PA needed.   triamcinolone (NASACORT) 55 MCG/ACT AERO nasal inhaler    Sig: Place 2 sprays into the nose daily.    Dispense:  16.5 g    Refill:  5   VENTOLIN HFA 108 (90 Base) MCG/ACT inhaler    Sig: Inhale 2 puffs into the lungs every 4 (four) hours as needed for wheezing or shortness of breath.    Dispense:  18 g    Refill:  1   levocetirizine (XYZAL) 5 MG tablet    Sig: Take 1 tablet (5 mg total) by mouth daily as needed for allergies (Can take an extra dose during flare ups.).    Dispense:  180 tablet    Refill:  1   PATADAY 0.2 % SOLN    Sig: Place 1 drop into both eyes 1 day or 1 dose.    Dispense:  2.5 mL    Refill:  5    Patient Instructions  At today's visit, she reports 1.  Allergen avoidance measures - dairy, wheat, banana, pollens, dust mite  2.  Treat and prevent inflammation:  A.  OTC Nasacort - 1-2 sprays each nostril 3-7 times per week.  In the right nostril, point the applicator out toward the right ear. In the left nostril, point the  applicator out toward the left ear  3. Treat and prevent sleep dysfunction:  A. Periactin 4 mg - 1/2 - 1 tablet at bedtime  4. Use nicotine substitutes to eliminate smoke exposure  5. If needed:  A. Epi-pen, MD/ER evaluation for allergic reaction. B. Levocetirizine 1 tablet 1 time per day C. Pataday - 1 drop each eye 1  time per day  D. Albuterol 2 puffs once every 4 hours if needed for cough or wheeze  6. Consider starting immunotherapy  7. Return to clinic in 2 months or sooner id needed   Return in about 2 months (around 03/20/2023), or if symptoms worsen or fail to improve.    Thank you for the opportunity to care for this patient.  Please do not hesitate to contact me with questions.  Thermon Leyland, FNP [facility]

## 2023-01-18 NOTE — Patient Instructions (Addendum)
At today's visit, she reports 1.  Allergen avoidance measures - dairy, wheat, banana, pollens, dust mite  2.  Treat and prevent inflammation:  A.  OTC Nasacort - 1-2 sprays each nostril 3-7 times per week.  In the right nostril, point the applicator out toward the right ear. In the left nostril, point the applicator out toward the left ear  3. Treat and prevent sleep dysfunction:  A. Periactin 4 mg - 1/2 - 1 tablet at bedtime  4. Use nicotine substitutes to eliminate smoke exposure  5. If needed:  A. Epi-pen, MD/ER evaluation for allergic reaction. B. Levocetirizine 1 tablet 1 time per day C. Pataday - 1 drop each eye 1 time per day  D. Albuterol 2 puffs once every 4 hours if needed for cough or wheeze  6. Consider starting immunotherapy  7. Return to clinic in 2 months or sooner id needed  Reducing Pollen Exposure The American Academy of Allergy, Asthma and Immunology suggests the following steps to reduce your exposure to pollen during allergy seasons. Do not hang sheets or clothing out to dry; pollen may collect on these items. Do not mow lawns or spend time around freshly cut grass; mowing stirs up pollen. Keep windows closed at night.  Keep car windows closed while driving. Minimize morning activities outdoors, a time when pollen counts are usually at their highest. Stay indoors as much as possible when pollen counts or humidity is high and on windy days when pollen tends to remain in the air longer. Use air conditioning when possible.  Many air conditioners have filters that trap the pollen spores. Use a HEPA room air filter to remove pollen form the indoor air you breathe.   Control of Dust Mite Allergen Dust mites play a major role in allergic asthma and rhinitis. They occur in environments with high humidity wherever human skin is found. Dust mites absorb humidity from the atmosphere (ie, they do not drink) and feed on organic matter (including shed human and animal  skin). Dust mites are a microscopic type of insect that you cannot see with the naked eye. High levels of dust mites have been detected from mattresses, pillows, carpets, upholstered furniture, bed covers, clothes, soft toys and any woven material. The principal allergen of the dust mite is found in its feces. A gram of dust may contain 1,000 mites and 250,000 fecal particles. Mite antigen is easily measured in the air during house cleaning activities. Dust mites do not bite and do not cause harm to humans, other than by triggering allergies/asthma.  Ways to decrease your exposure to dust mites in your home:  1. Encase mattresses, box springs and pillows with a mite-impermeable barrier or cover  2. Wash sheets, blankets and drapes weekly in hot water (130 F) with detergent and dry them in a dryer on the hot setting.  3. Have the room cleaned frequently with a vacuum cleaner and a damp dust-mop. For carpeting or rugs, vacuuming with a vacuum cleaner equipped with a high-efficiency particulate air (HEPA) filter. The dust mite allergic individual should not be in a room which is being cleaned and should wait 1 hour after cleaning before going into the room.  4. Do not sleep on upholstered furniture (eg, couches).  5. If possible removing carpeting, upholstered furniture and drapery from the home is ideal. Horizontal blinds should be eliminated in the rooms where the person spends the most time (bedroom, study, television room). Washable vinyl, roller-type shades are optimal.  6.  Remove all non-washable stuffed toys from the bedroom. Wash stuffed toys weekly like sheets and blankets above.  7. Reduce indoor humidity to less than 50%. Inexpensive humidity monitors can be purchased at most hardware stores. Do not use a humidifier as can make the problem worse and are not recommended.

## 2023-01-19 ENCOUNTER — Encounter: Payer: Self-pay | Admitting: Family Medicine

## 2023-01-19 DIAGNOSIS — Z720 Tobacco use: Secondary | ICD-10-CM | POA: Insufficient documentation

## 2023-01-19 DIAGNOSIS — H1010 Acute atopic conjunctivitis, unspecified eye: Secondary | ICD-10-CM | POA: Insufficient documentation

## 2023-01-19 DIAGNOSIS — J301 Allergic rhinitis due to pollen: Secondary | ICD-10-CM | POA: Insufficient documentation

## 2023-01-19 DIAGNOSIS — L500 Allergic urticaria: Secondary | ICD-10-CM | POA: Insufficient documentation

## 2023-01-19 DIAGNOSIS — R051 Acute cough: Secondary | ICD-10-CM | POA: Insufficient documentation

## 2023-01-19 DIAGNOSIS — G4720 Circadian rhythm sleep disorder, unspecified type: Secondary | ICD-10-CM | POA: Insufficient documentation

## 2023-01-19 DIAGNOSIS — T781XXA Other adverse food reactions, not elsewhere classified, initial encounter: Secondary | ICD-10-CM | POA: Insufficient documentation

## 2023-01-22 NOTE — Telephone Encounter (Signed)
I talked with this patient via phone. She reports that she is using albuterol with some relief of symptoms. She is interested in allergen immunotherapy and interested in quitting smoking. Can you please send out allergen immunotherapy information as well as smoking cessation from [employer]. Can you please help her make a follow up appointment for 3 weeks from today to evaluate her progress and make changes if needed. Also please let her know the chart has been amended. Thank you

## 2023-02-15 ENCOUNTER — Encounter (HOSPITAL_COMMUNITY): Payer: Self-pay | Admitting: Student in an Organized Health Care Education/Training Program

## 2023-02-15 ENCOUNTER — Telehealth (HOSPITAL_BASED_OUTPATIENT_CLINIC_OR_DEPARTMENT_OTHER): Payer: Medicaid Other | Admitting: Student in an Organized Health Care Education/Training Program

## 2023-02-15 ENCOUNTER — Telehealth: Payer: Self-pay

## 2023-02-15 DIAGNOSIS — F4310 Post-traumatic stress disorder, unspecified: Secondary | ICD-10-CM | POA: Diagnosis not present

## 2023-02-15 DIAGNOSIS — F331 Major depressive disorder, recurrent, moderate: Secondary | ICD-10-CM | POA: Diagnosis not present

## 2023-02-15 DIAGNOSIS — F17200 Nicotine dependence, unspecified, uncomplicated: Secondary | ICD-10-CM | POA: Diagnosis not present

## 2023-02-15 MED ORDER — TRAZODONE HCL 100 MG PO TABS
100.0000 mg | ORAL_TABLET | Freq: Every evening | ORAL | 1 refills | Status: AC | PRN
Start: 2023-02-15 — End: ?

## 2023-02-15 MED ORDER — BUPROPION HCL ER (XL) 300 MG PO TB24
300.0000 mg | ORAL_TABLET | ORAL | 1 refills | Status: AC
Start: 2023-02-15 — End: ?

## 2023-02-15 MED ORDER — ESCITALOPRAM OXALATE 20 MG PO TABS: 20.0000 mg | ORAL_TABLET | Freq: Every day | ORAL | 1 refills | Status: AC

## 2023-02-15 MED ORDER — PRAZOSIN HCL 2 MG PO CAPS
2.0000 mg | ORAL_CAPSULE | Freq: Every day | ORAL | 0 refills | Status: AC
Start: 2023-02-15 — End: ?

## 2023-02-15 NOTE — Telephone Encounter (Signed)
Is this something we are able to do for the patient?

## 2023-02-15 NOTE — Progress Notes (Signed)
Virtual Visit via Video Note  I connected with Haley Reyes on 02/15/23 at  1:00 PM EDT by a video enabled telemedicine application and verified that I am speaking with the correct person using two identifiers.  Location: Patient: staying with someone in [place] Provider: Office   I discussed the limitations of evaluation and management by telemedicine and the availability of in person appointments. The patient expressed understanding and agreed to proceed.     I discussed the assessment and treatment plan with the patient. The patient was provided an opportunity to ask questions and all were answered. The patient agreed with the plan and demonstrated an understanding of the instructions.   The patient was advised to call back or seek an in-person evaluation if the symptoms worsen or if the condition fails to improve as anticipated.  I provided 25 minutes of non-face-to-face time during this encounter.   Bobbye Morton, MD  [facility] MD/PA/NP OP Progress Note  02/15/2023 5:32 PM Haley Reyes  MRN:  [id number]  Chief Complaint:  Chief Complaint  Patient presents with   Follow-up   HPI: Haley Reyes is a 47 yo patient w/ a PPH of [place] PTSD, MDD, and anxiety w/ panic attacks. Current medication regimen:   Lexparo 20mg  daily Trazodone 50mg  QHS PRN Prazosin 2mg  QHS Wellbutrin XL 300mg  daily  Patient reports that she has not had any major events since last being seen. Patient reports that she does not wish to talk about everything that has been going on. Patient reports she still has frequent events of low mood. She is struggling with some ADLs on occasion, but does not give details on how often or what she is struggling with. Patient reports that she wants to take a break from psychiatry, she denies SI. Patient reports she is spending a lot of her time resting and denies HI.  Patient denies AVH.   Patient is smoking a cigarette on camera.   Patient reports that she wants to sleep. Patient  does not really watch TV. She reports that when she is not sleeping she is trying to get her housing situation taken care of. She reports that it is very frustration. She did try to reach out to the IR, but wasn't able to get a person. Patient reports that she prefers to not go out, she will when she has to, but she prefers to isolate herself. Patient reports that she does not think meeting people in person has a point as it is often disappointing and a "waste of time." She also has limited transportation. Patient feels like things do not work out for her.   Patient reports she does what she feels like doing, if she is able. Patient reports that she is "going with the flow." Patient reports that she is still having panic attacks, but cannot recall how many. She endorses that she feels like she can't get her thoughts together and is trying to calm herself down when she has an "attack."   Patinet reports her sleep vacillates, because she now feels the need, but she feels like she is not getting good rest yet.   THC- denies Etoh- not daily, but doesn't quantify Caffeine- no   Visit Diagnosis:    ICD-10-CM   1. MDD (major depressive disorder), recurrent episode, moderate (HCC)  F33.1 buPROPion (WELLBUTRIN XL) 300 MG 24 hr tablet    escitalopram (LEXAPRO) 20 MG tablet    2. Tobacco use disorder  F17.200  3. PTSD (Reyes-traumatic stress disorder)  F43.10 prazosin (MINIPRESS) 2 MG capsule    traZODone (DESYREL) 100 MG tablet      Past Psychiatric History: INPT: Denies OPT: Previous Dr. Lolly Mustache Therapy: yes has one she can get in with PRN Meds: Cymbalta (significant weight loss and hair loss in 1 mon),             Possible other meds: Geodon, Prozac, Zoloft, and depakote,   She states that all her providers have given her are "pain" medications including hydrocodone (which she couldn't tolerate). She has taken wellbutrin, celexa, cymbalta,  viibryd, and perhaps some stimulants as well per  EMR  She notes worsening problems with her memory over the last year or two. From a sleep standpoint, she has ups and downs. she has been seen by pulmonology for insomnia. Her pain often awakens her at night.    Last Visit: 10/2022- Established care. Patient did screen positive for PTSD and was having significant nightmares and hypervigilance as well as avoidance. Continued Lexparo 20mg  but also started prazosin 1mg  QHS. Patient was very guarded throughout assessment.   11/2022- Slowly became less guarded over the course of appt. Endorsed avoidant behaviors related to trauma and continued neurovegetative symptoms. Prazosin was increased to address nightmares and hypervigilance and Wellbutrin XL 150mg  was started to address continued neurovegetative symptoms. Continue Lexapro 20mg  daily.  01/2023-Patient continues to endorse symptoms of PTSD and GAD, including feeling physically tense, ruminating thoughts, hypervigilance and avoidant behaviors. At this time remain unsure how much of the guarded beahviors are 2/2 to PTSD or personality. Increased wellbutrin to 300mg .   Past Medical History:  Past Medical History:  Diagnosis Date   Angina at rest    Anxiety    Arthritis    Fibromyalgia    Insomnia    Neuropathy    PTSD (Reyes-traumatic stress disorder)     Past Surgical History:  Procedure Laterality Date   CHOLECYSTECTOMY  2000   cystic fibroids     TUBAL LIGATION      Family Psychiatric History: Son: During his childhood dx w/ ADHD and ODD, took Abilify Schizoaffective bipolar type: Maternal Aunt Bipolar: Brother and cousin Maternal GMA: Anxiety, took meds for this until death   Family History:  Family History  Problem Relation Age of Onset   Lung cancer Father    Hypertension Father    COPD Maternal Grandmother    Congestive Heart Failure Maternal Grandmother    Allergies Other        children x 4   Asthma Other        children x 4   Diabetes Other        fathers siblings    Allergies Brother    Congestive Heart Failure Other        aunt   Sleep apnea Brother    Hypertension Mother     Social History:  Social History   Socioeconomic History   Marital status: Single    Spouse name: Not on file   Number of children: 4   Years of education: Not on file   Highest education level: Not on file  Occupational History   Occupation: unemployed    Employer: NOT EMPLOYED  Tobacco Use   Smoking status: Former    Packs/day: .5    Types: Cigarettes    Start date: 09/04/1998    Quit date: 05/06/2021    Years since quitting: 1.7   Smokeless tobacco: Former  Advertising account planner  Vaping Use: Never used  Substance and Sexual Activity   Alcohol use: Yes    Comment: glass of red wine occassionally during the week. None at the time d/t new medications.    Drug use: No   Sexual activity: Not Currently  Other Topics Concern   Not on file  Social History Narrative   Are you right handed or left handed? left   Are you currently employed ? no   What is your current occupation?disability   Do you live at home alone?no   Who lives with you? daughter   What type of home do you live in: 1 story or 2 story? one    Caffeine none   Social Determinants of Health   Financial Resource Strain: Medium Risk (09/12/2018)   Overall Financial Resource Strain (CARDIA)    Difficulty of Paying Living Expenses: Somewhat hard  Food Insecurity: No Food Insecurity (09/12/2018)   Hunger Vital Sign    Worried About Running Out of Food in the Last Year: Never true    Ran Out of Food in the Last Year: Never true  Transportation Needs: No Transportation Needs (09/12/2018)   PRAPARE - Administrator, Civil Service (Medical): No    Lack of Transportation (Non-Medical): No  Physical Activity: Inactive (09/12/2018)   Exercise Vital Sign    Days of Exercise per Week: 0 days    Minutes of Exercise per Session: 0 min  Stress: Stress Concern Present (09/12/2018)   [employer] of Occupational  Health - Occupational Stress Questionnaire    Feeling of Stress : Rather much  Social Connections: Unknown (09/12/2018)   Social Connection and Isolation Panel [NHANES]    Frequency of Communication with Friends and Family: Twice a week    Frequency of Social Gatherings with Friends and Family: Once a week    Attends Religious Services: Never    Database administrator or Organizations: No    Attends Banker Meetings: Never    Marital Status: Not on file    Allergies:  Allergies  Allergen Reactions   Shrimp [Shellfish Allergy]    Wheat    Bee Pollen    Benadryl [Diphenhydramine Hcl]     unknown   Chicken Allergy    Codeine Other (See Comments)    Sweating real bad felt like she was going to pass out    Watermelon Flavor Itching    SOB   Hydrocodone-Ibuprofen Palpitations   Latex Itching and Rash   Other Nausea And Vomiting and Anxiety    *Caffine*    Metabolic Disorder Labs: Lab Results  Component Value Date   HGBA1C 5.4 11/15/2022   No results found for: "PROLACTIN" Lab Results  Component Value Date   CHOL 166 06/15/2022   TRIG 59 06/15/2022   HDL 56 06/15/2022   CHOLHDL 3.0 06/15/2022   LDLCALC 98 06/15/2022   LDLCALC 69 05/13/2021   Lab Results  Component Value Date   TSH 0.799 06/15/2022   TSH 0.758 02/24/2020    Therapeutic Level Labs: No results found for: "LITHIUM" No results found for: "VALPROATE" No results found for: "CBMZ"  Current Medications: Current Outpatient Medications  Medication Sig Dispense Refill   buPROPion (WELLBUTRIN XL) 300 MG 24 hr tablet Take 1 tablet (300 mg total) by mouth every morning. 30 tablet 1   cetirizine (ZYRTEC) 10 MG tablet Take 1 tablet (10 mg total) by mouth daily. 30 tablet 11   cromolyn (OPTICROM) 4 % ophthalmic solution  Place 1 drop into both eyes 4 (four) times daily as needed. 10 mL 5   cyclobenzaprine (FLEXERIL) 10 MG tablet Take 1 tablet (10 mg total) by mouth 3 (three) times daily as needed for  muscle spasms. 30 tablet 0   cyproheptadine (PERIACTIN) 4 MG tablet Take 1 tablet (4 mg total) by mouth at bedtime. Take a half a tablet or a whole tablet. 30 tablet 5   EPINEPHRINE 0.3 mg/0.3 mL IJ SOAJ injection Inject 0.3 mg into the muscle as needed for anaphylaxis. 2 each 1   escitalopram (LEXAPRO) 20 MG tablet Take 1 tablet (20 mg total) by mouth daily. 30 tablet 1   gabapentin (NEURONTIN) 300 MG capsule Take 1 capsule (300 mg total) by mouth 3 (three) times daily. 90 capsule 3   levocetirizine (XYZAL) 5 MG tablet Take 1 tablet (5 mg total) by mouth daily as needed for allergies (Can take an extra dose during flare ups.). 180 tablet 1   nicotine (NICODERM CQ - DOSED IN MG/24 HOURS) 14 mg/24hr patch Place 1 patch (14 mg total) onto the skin daily. 28 patch 0   PATADAY 0.2 % SOLN Place 1 drop into both eyes 1 day or 1 dose. 2.5 mL 5   prazosin (MINIPRESS) 2 MG capsule Take 1 capsule (2 mg total) by mouth at bedtime. 90 capsule 0   RESTASIS 0.05 % ophthalmic emulsion Place 1 drop into both eyes 2 (two) times daily. 0.4 mL 2   traZODone (DESYREL) 100 MG tablet Take 1 tablet (100 mg total) by mouth at bedtime as needed for sleep. 30 tablet 1   triamcinolone (NASACORT) 55 MCG/ACT AERO nasal inhaler Place 2 sprays into the nose daily. 16.5 g 5   VENTOLIN HFA 108 (90 Base) MCG/ACT inhaler Inhale 2 puffs into the lungs every 4 (four) hours as needed for wheezing or shortness of breath. 18 g 1   No current facility-administered medications for this visit.       Psychiatric Specialty Exam: Review of Systems  Psychiatric/Behavioral:  Positive for dysphoric mood and sleep disturbance. Negative for hallucinations and suicidal ideas.     There were no vitals taken for this visit.There is no height or weight on file to calculate BMI.  General Appearance: Casual smoking  Eye Contact:  Fair  Speech:  Clear and Coherent  Volume:  Decreased  Mood:  Dysphoric  Affect:  Appropriate and Congruent   Thought Process:  Goal Directed  Orientation:  Full (Time, Place, and Person)  Thought Content: Rumination   Suicidal Thoughts:  No  Homicidal Thoughts:  No  Memory:  Immediate;   Fair Recent;   Good  Judgement:  Poor  Insight:  Shallow  Psychomotor Activity:  Normal  Concentration:  Concentration: Poor not sure she really tries to focus with effort, instead preferring to "I don't really want to talk/go down that path right now."  Recall:  Fair  Fund of Knowledge: Fair  Language: Good  Akathisia:  NA  Handed:    AIMS (if indicated): not done  Assets:  Communication Skills Desire for Improvement Resilience  ADL's:  Impaired  Cognition: WNL  Sleep:  Poor   Screenings: [employer] [facility] Office Visit from 12/07/2022 in [facility] Office Visit from 06/15/2022 in [facility] Office Visit from 01/11/2022 in [facility] Office Visit from 05/13/2021 in [facility] Office Visit from 02/24/2020 in [facility]  PHQ-2 Total Score 2 2 6 1 2   PHQ-9 Total Score 10 4 9 12 4       Flowsheet Row ED from 05/13/2021 in [facility] at [facility] Video Visit from 03/28/2021 in [facility] Video Visit from [facility]/27[facility]2020 [facility]  C-SSRS RISK CATEGORY No Risk No Risk No Risk        Assessment and Plan: Patient continues to endorse symptoms of being stressed and overwhelmed and not getting good rest, but is also avoiding certain tasks and prefering to not do much. Some of these neurovegetative behaviors sound more personality driven as patient does not endorse much of a reason other than not seeing much of a point and having criteria for others before exerting more effort; however some could also be depression as patient does endorse elements of hopelessness. Given patient's anxiety and  depression, with feeling like she is not getting enough rest, will continue medications as they are with the exception of the trazodone increasing it to 100mg  QHS with the hope of patient getting better rest to help her feel less anxious and perhaps increase her motivation to get things done during the day.  Attempted to target neurovegetative symptoms at prior appt by increasing patient's Wellbutrin.  While patient continues to endorse neurovegetative symptoms during assessment patient is seen smoking and pacing and there is concern that patient's tobacco/nicotine use is also increasing this anxious like behavior.  Will like to hold on targeting endorse neurovegetative symptoms at this time, out of caution for inducing anxiety and patient.   Overall patient presents with elements of schizoid personality disorder.  Patient has been very guarded throughout multiple assessments and endorses multiple times distressed towards individual to the point that she isolates herself and prefers to not interact with others.  Patient also endorses lacking) in relationships.  She struggles to find pleasure in activities as endorsed multiple times today on assessment and previous assessments.  Would continue to monitor for the symptoms before formal diagnoses. Patient's PTSD dx presents as a comorbid dx.  Chronic PTSD MDD Traits of schizoid personality disorder - Continue Lexapro 20mg  daily - Continue Prazosin to 2mg  QHS -Increase Wellbutrin XL to 300mg  daily -Increase trazodone to 100 mg nightly as needed   Tobacco use disorder, mild - Not ready to quit currently  Discussed with patient pending transition of care to a new resident starting July 1st, and likely discontinuation of care by this provider at that time.    F/u w/ Dr. Alfonse Flavors  Collaboration of Care: Collaboration of Care:   Patient/Guardian was advised Release of Information must be obtained prior to any record release in order to collaborate their  care with an outside provider. Patient/Guardian was advised if they have not already done so to contact the registration department to sign all necessary forms in order for [place] to release information regarding their care.   Consent: Patient/Guardian gives verbal consent for treatment and assignment of benefits for services provided during this visit. Patient/Guardian expressed understanding and agreed to proceed.   PGY-3 Bobbye Morton, MD 02/15/2023, 5:32 PM

## 2023-02-15 NOTE — Telephone Encounter (Signed)
Chart review indicates she had lab testing 06/2022 for foods. She should call her insurance to see if it will be covered since it has been under 1 year.

## 2023-02-15 NOTE — Telephone Encounter (Signed)
Patient called to see if she is able to get labs done for all foods, especially shell fish and wheat. She does not want to do skin testing again.  Please advise

## 2023-02-16 DIAGNOSIS — R3981 Functional urinary incontinence: Secondary | ICD-10-CM | POA: Diagnosis not present

## 2023-02-16 DIAGNOSIS — R32 Unspecified urinary incontinence: Secondary | ICD-10-CM | POA: Diagnosis not present

## 2023-02-16 NOTE — Addendum Note (Signed)
Addended by: Everlena Cooper on: 02/16/2023 11:32 AM   Modules accepted: Level of Service

## 2023-02-16 NOTE — Telephone Encounter (Signed)
Spoke with pt informed her of the labs done in oct she had forgotten about it she does want to wait a year so that insurance will cover it

## 2023-02-27 ENCOUNTER — Telehealth: Payer: Self-pay | Admitting: Nurse Practitioner

## 2023-02-27 ENCOUNTER — Telehealth (HOSPITAL_COMMUNITY): Payer: Self-pay | Admitting: Student in an Organized Health Care Education/Training Program

## 2023-02-27 NOTE — Telephone Encounter (Signed)
Pt LVM on nurse line on 02/27/23 at 10:49am requesting a call back ASAP regarding paperwork she needs to discuss. Called pt back and she would like her AVS printed out since she can't print from MyChart.

## 2023-02-28 NOTE — Telephone Encounter (Signed)
Provider called patient back. Patient reports that she wanted a letter that tells her what she was diagnosed with, seen in the office for and being treated for.    PGY-3 Eliseo Gum, MD

## 2023-03-05 NOTE — Telephone Encounter (Signed)
These were done last week [facility]

## 2023-03-22 DIAGNOSIS — R32 Unspecified urinary incontinence: Secondary | ICD-10-CM | POA: Diagnosis not present

## 2023-03-22 DIAGNOSIS — R3981 Functional urinary incontinence: Secondary | ICD-10-CM | POA: Diagnosis not present

## 2023-04-17 ENCOUNTER — Other Ambulatory Visit: Payer: Self-pay | Admitting: *Deleted

## 2023-04-17 ENCOUNTER — Telehealth: Payer: Self-pay | Admitting: Family Medicine

## 2023-04-17 NOTE — Telephone Encounter (Signed)
Patient does not want to use Epipen due to fear of needles. In any case she ahs to use it she would like to be able to use the new drug called "Neffy" which is like the pen, but a nasal consumption. Please call and advise. [phone]

## 2023-04-17 NOTE — Telephone Encounter (Signed)
I did just double check it is so new it is not in our database yet to even order the prescription.

## 2023-04-17 NOTE — Telephone Encounter (Signed)
Patient would like the provider to send in [facility] as an alternative to the epi-pen please advise.

## 2023-04-17 NOTE — Telephone Encounter (Signed)
It was just approved on April 13, 2023. Will be available in about 8 weeks.

## 2023-04-19 NOTE — Telephone Encounter (Signed)
I called and advised the patient and also advised that it may not be covered by her [facility]. Patient verbalized understanding and stated that in 8 weeks she will call and we can try to send it in and see what the insurance says.

## 2023-04-22 DIAGNOSIS — R3981 Functional urinary incontinence: Secondary | ICD-10-CM | POA: Diagnosis not present

## 2023-04-22 DIAGNOSIS — R32 Unspecified urinary incontinence: Secondary | ICD-10-CM | POA: Diagnosis not present

## 2023-04-30 ENCOUNTER — Telehealth: Payer: Self-pay | Admitting: *Deleted

## 2023-04-30 NOTE — Telephone Encounter (Signed)
Patient called and stated that the Nasacort is not covered by her insurance and would like a nasal spray that is covered.

## 2023-05-01 ENCOUNTER — Telehealth: Payer: Self-pay | Admitting: Family Medicine

## 2023-05-01 MED ORDER — FLUTICASONE PROPIONATE 50 MCG/ACT NA SUSP: NASAL | 5 refills | Status: DC

## 2023-05-01 NOTE — Telephone Encounter (Signed)
Patient called stating her insurance will not cover medication triamcinolone (NASACORT) 55 MCG/ACT AERO nasal inhaler [[id number]] asking for an alternative please advise

## 2023-05-01 NOTE — Addendum Note (Signed)
Addended by: Berna Bue on: 05/01/2023 04:30 PM   Modules accepted: Orders

## 2023-05-01 NOTE — Telephone Encounter (Signed)
Can you please send in Flonase 2 sprays in each nostril once a day as needed for a stuffy nose

## 2023-05-01 NOTE — Telephone Encounter (Signed)
Sent in flonase to [facility]

## 2023-05-01 NOTE — Telephone Encounter (Signed)
Patient stated her insurance doesn't cover Nasacort, so I spoke to [place] and informed her Nasocort can be purchased over the counter but she stated she needs something that can be prescribed due to financial issues.  Lamar Laundry [phone]

## 2023-05-02 ENCOUNTER — Ambulatory Visit: Payer: Self-pay | Admitting: Nurse Practitioner

## 2023-05-10 ENCOUNTER — Ambulatory Visit: Payer: Self-pay | Admitting: Nurse Practitioner

## 2023-05-28 DIAGNOSIS — R32 Unspecified urinary incontinence: Secondary | ICD-10-CM | POA: Diagnosis not present

## 2023-05-28 DIAGNOSIS — R3981 Functional urinary incontinence: Secondary | ICD-10-CM | POA: Diagnosis not present

## 2023-07-02 ENCOUNTER — Ambulatory Visit: Payer: Self-pay | Admitting: Nurse Practitioner

## 2023-07-09 ENCOUNTER — Ambulatory Visit: Payer: Self-pay | Admitting: Nurse Practitioner

## 2023-07-12 ENCOUNTER — Ambulatory Visit: Payer: Self-pay | Admitting: Nurse Practitioner

## 2023-07-19 ENCOUNTER — Ambulatory Visit: Payer: Self-pay | Admitting: Nurse Practitioner

## 2023-07-25 ENCOUNTER — Encounter: Payer: Self-pay | Admitting: Nurse Practitioner

## 2023-07-25 ENCOUNTER — Ambulatory Visit: Payer: Medicaid Other | Admitting: Nurse Practitioner

## 2023-07-25 VITALS — BP 119/81 | HR 94 | Temp 98.1°F | Resp 12 | Ht 61.0 in | Wt 127.0 lb

## 2023-07-25 DIAGNOSIS — R1084 Generalized abdominal pain: Secondary | ICD-10-CM | POA: Diagnosis not present

## 2023-07-25 DIAGNOSIS — N926 Irregular menstruation, unspecified: Secondary | ICD-10-CM

## 2023-07-25 LAB — POCT URINALYSIS DIP (CLINITEK)
Bilirubin, UA: NEGATIVE
Blood, UA: NEGATIVE
Glucose, UA: NEGATIVE mg/dL
Ketones, POC UA: NEGATIVE mg/dL
Leukocytes, UA: NEGATIVE
Nitrite, UA: NEGATIVE
POC PROTEIN,UA: NEGATIVE
Spec Grav, UA: 1.02 (ref 1.010–1.025)
Urobilinogen, UA: 1 U/dL
pH, UA: 7 (ref 5.0–8.0)

## 2023-07-25 LAB — POCT URINE PREGNANCY: Preg Test, Ur: NEGATIVE

## 2023-07-25 NOTE — Progress Notes (Signed)
Subjective   Patient ID: Haley Reyes, female    DOB: 01-26-1976, 47 y.o.   MRN: [id number]  Chief Complaint  Patient presents with   Menstrual Problem    Referring provider: Ivonne Andrew, NP  Haley Reyes is a 47 y.o. female with Past Medical History: No date: Angina at rest [facility]) No date: Anxiety No date: Arthritis No date: Fibromyalgia No date: Insomnia No date: Neuropathy No date: PTSD (post-traumatic stress disorder)   HPI  Patient presents today for pregnancy check.  She states that she has not had a menstrual cycle in the past 2 months.  She states that she has been having food cravings, missed periods, breast tenderness.  She does have stomach distention.  Pregnancy test in office today is negative.  We will check labs and order abdominal ultrasound. Denies f/c/s, n/v/d, hemoptysis, PND, leg swelling Denies chest pain or edema       Allergies  Allergen Reactions   Shrimp [Shellfish Allergy]    Wheat    Bee Pollen    Benadryl [Diphenhydramine Hcl]     unknown   Chicken Allergy    Codeine Other (See Comments)    Sweating real bad felt like she was going to pass out    Watermelon Flavoring Agent (Non-Screening) Itching    SOB   Hydrocodone-Ibuprofen Palpitations   Latex Itching and Rash   Other Nausea And Vomiting and Anxiety    *Caffine*    There is no immunization history for the selected administration types on file for this patient.  Tobacco History: Social History   Tobacco Use  Smoking Status Former   Current packs/day: 0.00   Average packs/day: 0.5 packs/day for 22.7 years (11.3 ttl pk-yrs)   Types: Cigarettes   Start date: 09/04/1998   Quit date: 05/06/2021   Years since quitting: 2.3  Smokeless Tobacco Former   Counseling given: Not Answered   Outpatient Encounter Medications as of 07/25/2023  Medication Sig   buPROPion (WELLBUTRIN XL) 300 MG 24 hr tablet Take 1 tablet (300 mg total) by mouth every morning.   cetirizine (ZYRTEC)  10 MG tablet Take 1 tablet (10 mg total) by mouth daily.   cromolyn (OPTICROM) 4 % ophthalmic solution Place 1 drop into both eyes 4 (four) times daily as needed.   cyclobenzaprine (FLEXERIL) 10 MG tablet Take 1 tablet (10 mg total) by mouth 3 (three) times daily as needed for muscle spasms.   cyproheptadine (PERIACTIN) 4 MG tablet Take 1 tablet (4 mg total) by mouth at bedtime. Take a half a tablet or a whole tablet.   EPINEPHRINE 0.3 mg/0.3 mL IJ SOAJ injection Inject 0.3 mg into the muscle as needed for anaphylaxis.   escitalopram (LEXAPRO) 20 MG tablet Take 1 tablet (20 mg total) by mouth daily.   fluticasone (FLONASE) 50 MCG/ACT nasal spray 2 sprays each nostril daily as needed for stuffy nose   gabapentin (NEURONTIN) 300 MG capsule Take 1 capsule (300 mg total) by mouth 3 (three) times daily.   levocetirizine (XYZAL) 5 MG tablet Take 1 tablet (5 mg total) by mouth daily as needed for allergies (Can take an extra dose during flare ups.).   nicotine (NICODERM CQ - DOSED IN MG/24 HOURS) 14 mg/24hr patch Place 1 patch (14 mg total) onto the skin daily.   PATADAY 0.2 % SOLN Place 1 drop into both eyes 1 day or 1 dose.   prazosin (MINIPRESS) 2 MG capsule Take 1 capsule (2 mg total) by  mouth at bedtime.   RESTASIS 0.05 % ophthalmic emulsion Place 1 drop into both eyes 2 (two) times daily.   traZODone (DESYREL) 100 MG tablet Take 1 tablet (100 mg total) by mouth at bedtime as needed for sleep.   triamcinolone (NASACORT) 55 MCG/ACT AERO nasal inhaler Place 2 sprays into the nose daily.   VENTOLIN HFA 108 (90 Base) MCG/ACT inhaler Inhale 2 puffs into the lungs every 4 (four) hours as needed for wheezing or shortness of breath.   No facility-administered encounter medications on file as of 07/25/2023.    Review of Systems  Review of Systems  Constitutional: Negative.   HENT: Negative.    Cardiovascular: Negative.   Gastrointestinal: Negative.   Allergic/Immunologic: Negative.   Neurological:  Negative.   Psychiatric/Behavioral: Negative.       Objective:   BP 119/81 (BP Location: Left Arm, Patient Position: Sitting, Cuff Size: Normal)   Pulse 94   Temp 98.1 F (36.7 C)   Resp 12   Ht 5\' 1"  (1.549 m)   Wt 127 lb (57.6 kg)   LMP 05/21/2023   SpO2 100%   BMI 24.00 kg/m   Wt Readings from Last 5 Encounters:  07/25/23 127 lb (57.6 kg)  01/18/23 130 lb 12.8 oz (59.3 kg)  12/07/22 130 lb 3.2 oz (59.1 kg)  11/15/22 129 lb (58.5 kg)  06/15/22 141 lb (64 kg)     Physical Exam Vitals and nursing note reviewed.  Constitutional:      General: She is not in acute distress.    Appearance: She is well-developed.  Cardiovascular:     Rate and Rhythm: Normal rate and regular rhythm.  Pulmonary:     Effort: Pulmonary effort is normal.     Breath sounds: Normal breath sounds.  Neurological:     Mental Status: She is alert and oriented to person, place, and time.       Assessment & Plan:   Missed period -     POCT URINALYSIS DIP (CLINITEK) -     POCT urine pregnancy -     Luteinizing hormone -     Follicle stimulating hormone -     hCG, quantitative, pregnancy -     CBC -     Comprehensive metabolic panel -     hCG, serum, qualitative  Generalized abdominal pain -     US Abdomen Complete     No follow-ups on file.   Ivonne Andrew, NP 08/31/2023

## 2023-07-26 ENCOUNTER — Other Ambulatory Visit: Payer: Medicaid Other

## 2023-07-26 LAB — COMPREHENSIVE METABOLIC PANEL
ALT: 17 [IU]/L (ref 0–32)
AST: 16 [IU]/L (ref 0–40)
Albumin: 4.5 g/dL (ref 3.9–4.9)
Alkaline Phosphatase: 50 [IU]/L (ref 44–121)
BUN/Creatinine Ratio: 11 (ref 9–23)
BUN: 9 mg/dL (ref 6–24)
Bilirubin Total: <0.2 mg/dL (ref 0.0–1.2)
CO2: 24 mmol/L (ref 20–29)
Calcium: 9.4 mg/dL (ref 8.7–10.2)
Chloride: 103 mmol/L (ref 96–106)
Creatinine, Ser: 0.82 mg/dL (ref 0.57–1.00)
Globulin, Total: 2.5 g/dL (ref 1.5–4.5)
Glucose: 90 mg/dL (ref 70–99)
Potassium: 3.8 mmol/L (ref 3.5–5.2)
Sodium: 139 mmol/L (ref 134–144)
Total Protein: 7 g/dL (ref 6.0–8.5)
eGFR: 90 mL/min/{1.73_m2} (ref >59)

## 2023-07-26 LAB — HCG, SERUM, QUALITATIVE: hCG,Beta Subunit,Qual,Serum: NEGATIVE m[IU]/mL (ref <6)

## 2023-07-26 LAB — CBC
Hematocrit: 40.9 % (ref 34.0–46.6)
Hemoglobin: 13.2 g/dL (ref 11.1–15.9)
MCH: 30.4 pg (ref 26.6–33.0)
MCHC: 32.3 g/dL (ref 31.5–35.7)
MCV: 94 fL (ref 79–97)
Platelets: 339 10*3/uL (ref 150–450)
RBC: 4.34 x10E6/uL (ref 3.77–5.28)
RDW: 12.1 % (ref 11.7–15.4)
WBC: 7.4 10*3/uL (ref 3.4–10.8)

## 2023-07-26 LAB — FOLLICLE STIMULATING HORMONE: FSH: 7.8 m[IU]/mL

## 2023-07-26 LAB — LUTEINIZING HORMONE: LH: 22.4 m[IU]/mL

## 2023-07-30 ENCOUNTER — Other Ambulatory Visit: Payer: Medicaid Other

## 2023-08-31 ENCOUNTER — Encounter: Payer: Self-pay | Admitting: Nurse Practitioner

## 2023-08-31 NOTE — Patient Instructions (Signed)
1. Missed period (Primary)  - POCT URINALYSIS DIP (CLINITEK) - POCT urine pregnancy - LH - Follicle Stimulating Hormone - hCG, quantitative, pregnancy - CBC - Comprehensive metabolic panel - hCG, serum, qualitative  2. Generalized abdominal pain  - US Abdomen Complete

## 2023-09-09 IMAGING — CR DG FOOT COMPLETE 3+V*L*
3 series · 3 of 3 positions shown · non-contrast
Comparison: None.

CLINICAL DATA: Bilateral foot pain.

EXAM:
LEFT FOOT - COMPLETE 3+ VIEW

[x foot ap left]
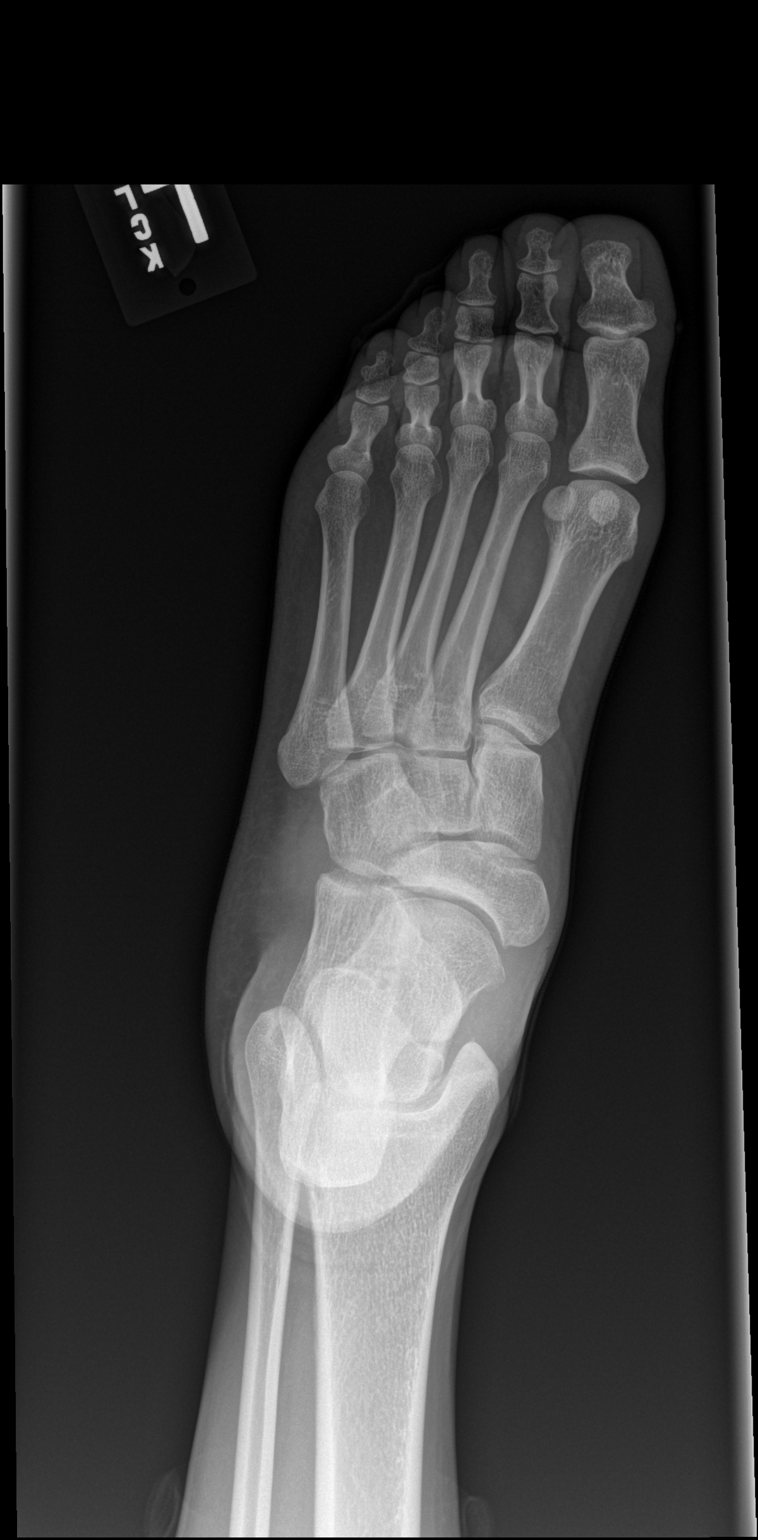

[x foot obl left]
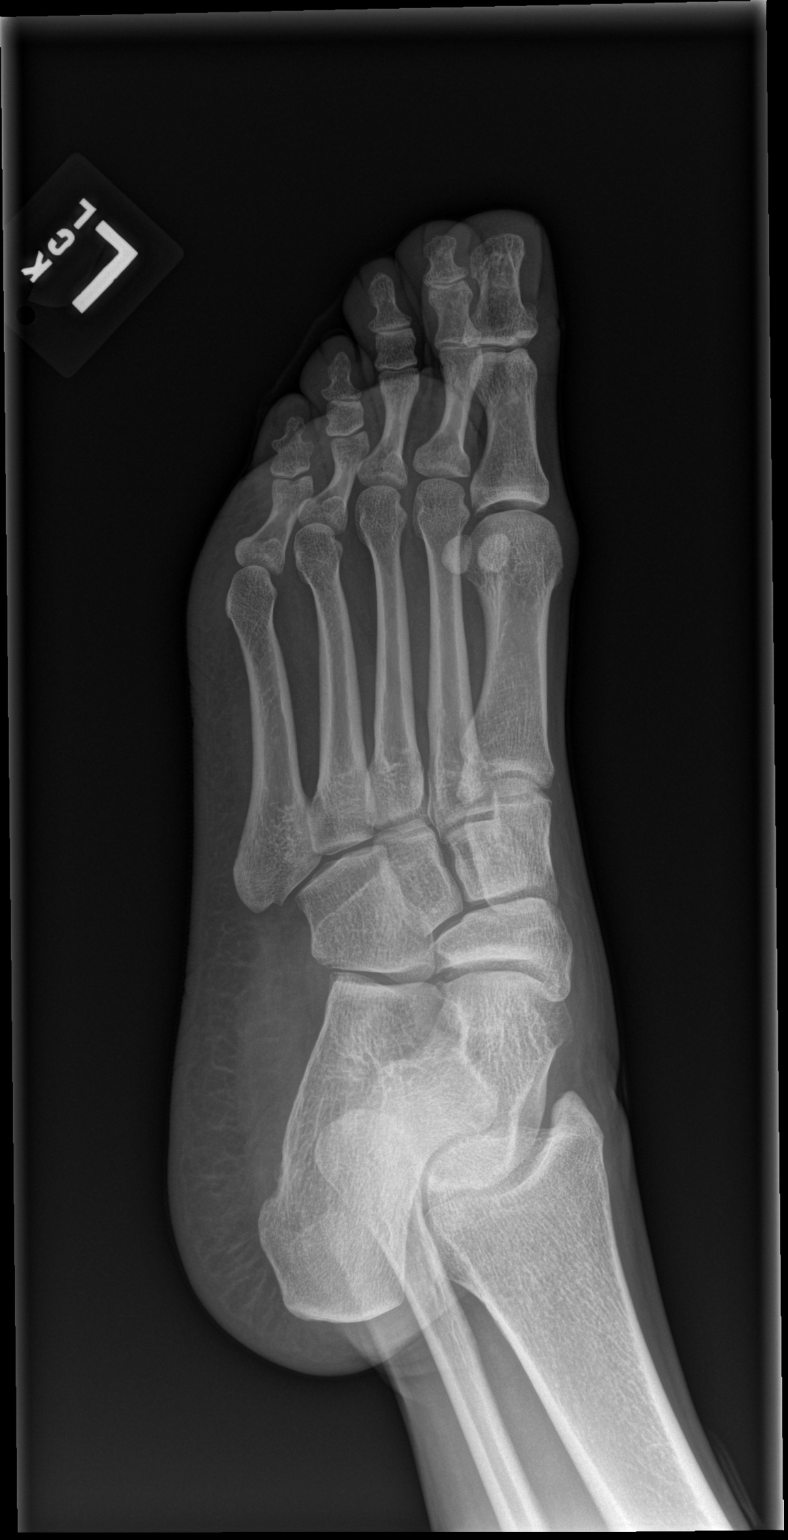

[x foot lat left]
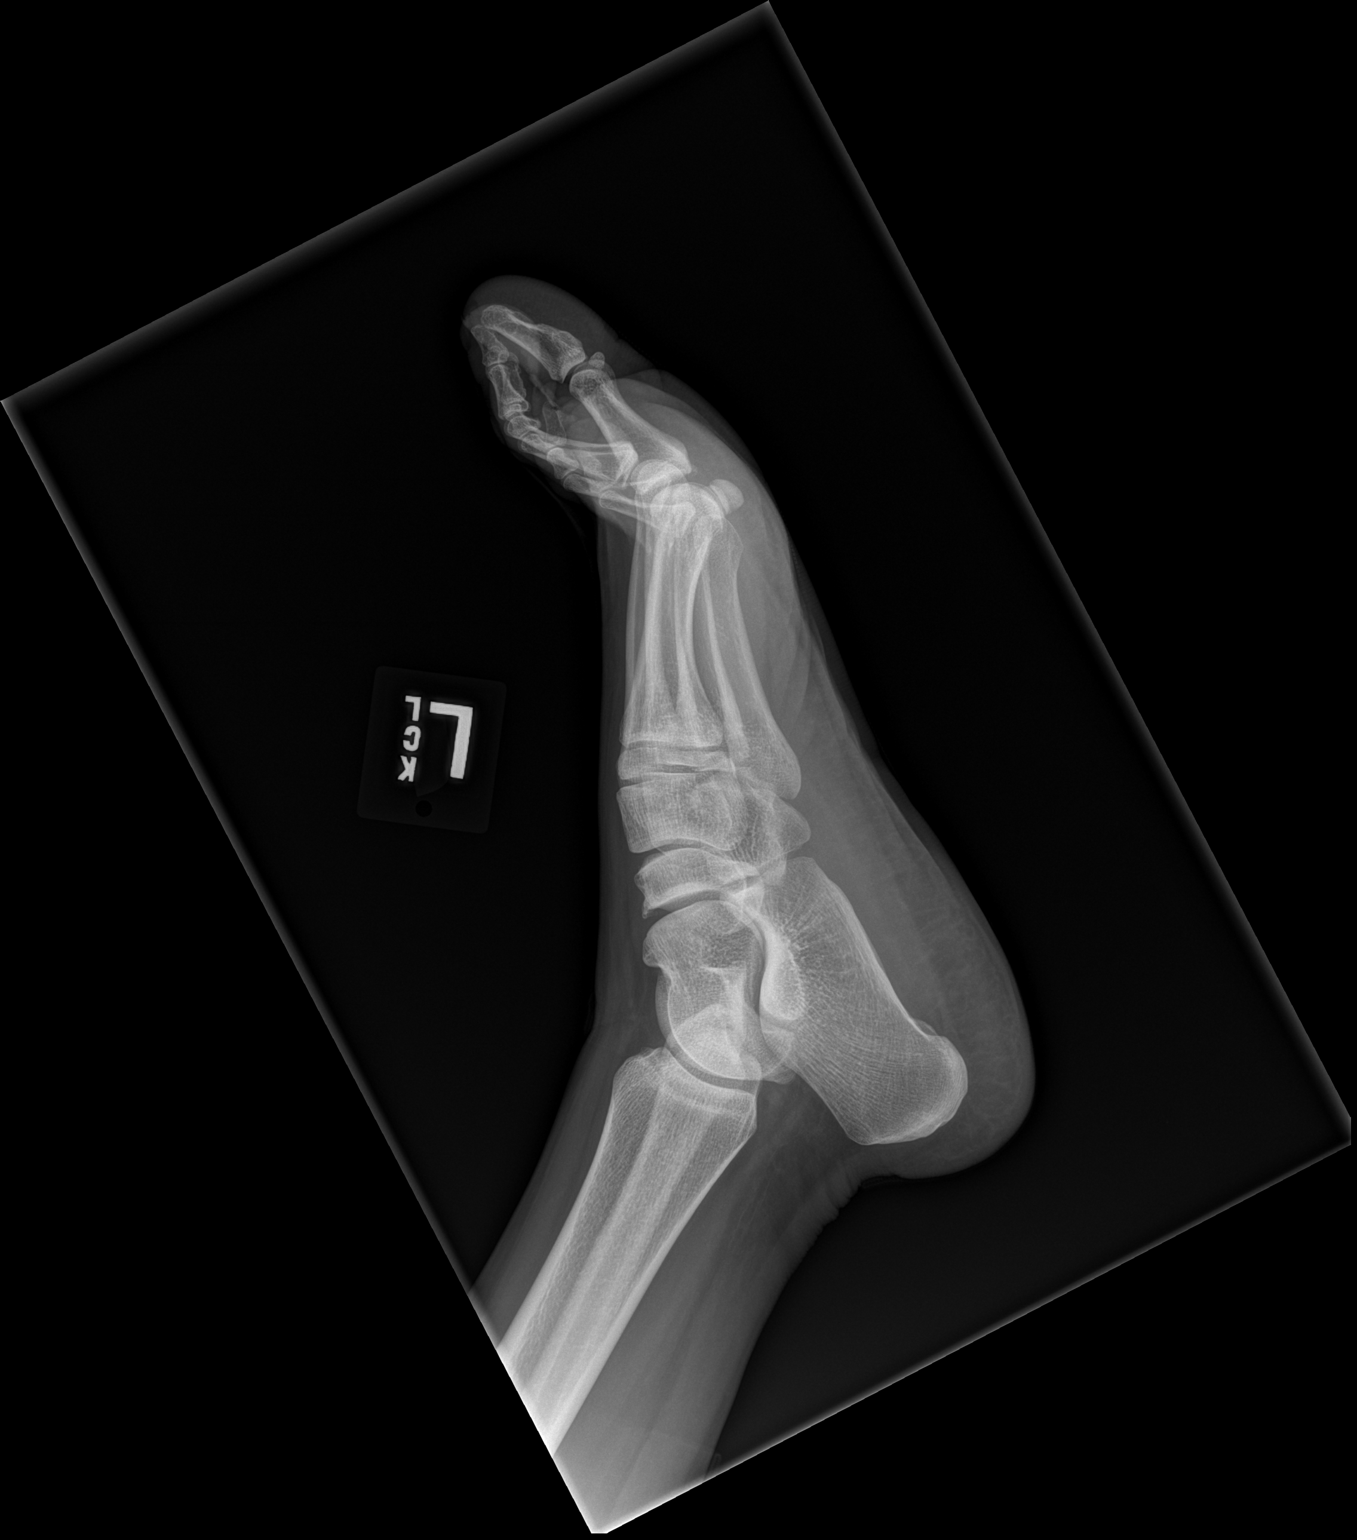

[3 of 3 positions shown; findings below may reference images not displayed]

FINDINGS: No acute fracture, dislocation, or destructive osseous process is
identified. Joint space widths are preserved. Mild spurring is noted
along the dorsal aspect of the navicular. The soft tissues are
unremarkable.
IMPRESSION: No acute osseous abnormality.

## 2023-09-10 ENCOUNTER — Other Ambulatory Visit: Payer: Medicaid Other

## 2023-09-18 ENCOUNTER — Telehealth: Payer: Self-pay

## 2023-09-18 ENCOUNTER — Telehealth: Payer: Self-pay | Admitting: Nurse Practitioner

## 2023-09-18 NOTE — Telephone Encounter (Signed)
 See note

## 2023-09-18 NOTE — Telephone Encounter (Signed)
 Copied from CRM [id number]. Topic: Medical Record Request - Records Request >> Sep 18, 2023  8:16 AM Arlina R wrote: Reason for CRM: Patient needs  records from September 1st 2018 until August 29th 2022. Showing her establishment with [facility] records. Response is needed asap because for patient deadline is January 17th, 2025!

## 2023-10-01 DIAGNOSIS — R3981 Functional urinary incontinence: Secondary | ICD-10-CM | POA: Diagnosis not present

## 2023-10-01 DIAGNOSIS — R32 Unspecified urinary incontinence: Secondary | ICD-10-CM | POA: Diagnosis not present

## 2023-10-03 ENCOUNTER — Telehealth: Payer: Self-pay

## 2023-10-03 NOTE — Telephone Encounter (Unsigned)
Copied from CRM [id number]. Topic: Clinical - Medical Advice >> Oct 03, 2023  9:49 AM Hector Shade B wrote: Reason for CRM: Azzie [phone]; patient called that she has a brace on her arm that is extremely supportive and that she does have to sleep with; however, with the metal support in there its extremely painful when waking up the next day wanted to know if the provider could sugggest something else that is a little more comfort. Patient also she is having problem with  her feet both feel are hurting her but right is more problematic.

## 2023-10-05 ENCOUNTER — Other Ambulatory Visit: Payer: Self-pay | Admitting: Nurse Practitioner

## 2023-10-05 ENCOUNTER — Telehealth: Payer: Self-pay

## 2023-10-05 MED ORDER — DICLOFENAC SODIUM 1 % EX GEL: 2.0000 g | Freq: Four times a day (QID) | CUTANEOUS | 2 refills | Status: AC

## 2023-10-05 NOTE — Telephone Encounter (Signed)
Copied from CRM [id number]. Topic: Clinical - Medical Advice >> Oct 05, 2023 11:43 AM Haley Reyes wrote: Reason for CRM: Please call patient regarding brace for arm and also leg pain, scheduled appointment but it is not until March

## 2023-11-08 ENCOUNTER — Encounter: Payer: Self-pay | Admitting: Nurse Practitioner

## 2023-12-13 DIAGNOSIS — R32 Unspecified urinary incontinence: Secondary | ICD-10-CM | POA: Diagnosis not present

## 2023-12-13 DIAGNOSIS — R3981 Functional urinary incontinence: Secondary | ICD-10-CM | POA: Diagnosis not present

## 2023-12-26 ENCOUNTER — Encounter: Payer: Self-pay | Admitting: Nurse Practitioner

## 2023-12-26 ENCOUNTER — Telehealth (INDEPENDENT_AMBULATORY_CARE_PROVIDER_SITE_OTHER): Payer: Self-pay | Admitting: Nurse Practitioner

## 2023-12-26 ENCOUNTER — Ambulatory Visit: Payer: Self-pay

## 2023-12-26 DIAGNOSIS — G629 Polyneuropathy, unspecified: Secondary | ICD-10-CM

## 2023-12-26 DIAGNOSIS — M62838 Other muscle spasm: Secondary | ICD-10-CM | POA: Diagnosis not present

## 2023-12-26 DIAGNOSIS — Z0000 Encounter for general adult medical examination without abnormal findings: Secondary | ICD-10-CM

## 2023-12-26 MED ORDER — GABAPENTIN 300 MG PO CAPS: 300.0000 mg | ORAL_CAPSULE | Freq: Three times a day (TID) | ORAL | 3 refills | Status: AC

## 2023-12-26 MED ORDER — CYCLOBENZAPRINE HCL 10 MG PO TABS: 10.0000 mg | ORAL_TABLET | Freq: Three times a day (TID) | ORAL | 0 refills | Status: AC | PRN

## 2023-12-26 NOTE — Telephone Encounter (Signed)
 Chief Complaint: Generalized pain Symptoms: Generalized pain flare up all over body Frequency: worsening over last few weeks Pertinent Negatives: Patient denies sickle cell disease Disposition: [] ED /[] Urgent Care (no appt availability in office) / [x] Appointment(In office/virtual)/ []  [facility]/ [] Home Care/ [] Refused Recommended Disposition /[] [facility]/ []  Follow-up with PCP Additional Notes: Patient called in requesting pain medication be called in due to a pain flare up she is experiencing. Patient states her PCP is aware of her history of fibromyalgia and osteoarthritis, but it has been quite a bit of time since her last episode that was needed to be treated with prescription pain medication. Patient offered virtual appointment to discuss symptoms with PCP to determine best plan of care.   Copied from CRM [id number]. Topic: Clinical - Red Word Triage >> Dec 26, 2023  8:59 AM Elle L wrote: Red Word that prompted transfer to Nurse Triage: The patient states that she is in severe pain all over. She states it is in her back, hands and knees. She states it is something that comes and goes but started worsening a few days ago. The patient was requesting to see if pain medication could be called in for her without going into the office for an appointment. Reason for Disposition  Caller requesting an appointment, triage offered and declined  Answer Assessment - Initial Assessment Questions 1. REASON FOR CALL or QUESTION: "What is your reason for calling today?" or "How can I best help you?" or "What question do you have that I can help answer?"     Please see patient notes 2. CALLER: Document the source of call. (e.g., laboratory, patient).     Patient  Protocols used: PCP Call - No Triage-A-AH

## 2023-12-26 NOTE — Patient Instructions (Signed)
Myofascial Pain Syndrome and Fibromyalgia Myofascial pain syndrome and fibromyalgia are both pain disorders. You may feel this pain mainly in your muscles. Myofascial pain syndrome: Always has tender points in the muscles that will cause pain when pressed (trigger points). The pain may come and go. Usually affects your neck, upper back, and shoulder areas. The pain often moves into your arms and hands. Fibromyalgia: Has muscle pains and tenderness that come and go. Is often associated with tiredness (fatigue) and sleep problems. Has trigger points. Tends to be long-lasting (chronic), but is not life-threatening. Fibromyalgia and myofascial pain syndrome are not the same. However, they often occur together. If you have both conditions, each can make the other worse. Both are common and can cause enough pain and fatigue to make day-to-day activities difficult. Both can be hard to diagnose because their symptoms are common in many other conditions. What are the causes? The exact causes of these conditions are not known. What increases the risk? You are more likely to develop either of these conditions if: You have a family history of the condition. You are female. You have certain triggers, such as: Spine disorders. An injury (trauma) or other physical stressors. Being under a lot of stress. Medical conditions such as osteoarthritis, rheumatoid arthritis, or lupus. What are the signs or symptoms? Fibromyalgia The main symptom of fibromyalgia is widespread pain and tenderness in your muscles. Pain is sometimes described as stabbing, shooting, or burning. You may also have: Tingling or numbness. Sleep problems and fatigue. Problems with attention and concentration (fibro fog). Other symptoms may include: Bowel and bladder problems. Headaches. Vision problems. Sensitivity to odors and noises. Depression or mood changes. Painful menstrual periods (dysmenorrhea). Dry skin or eyes. These  symptoms can vary over time. Myofascial pain syndrome Symptoms of myofascial pain syndrome include: Tight, ropy bands of muscle. Uncomfortable sensations in muscle areas. These may include aching, cramping, burning, numbness, tingling, and weakness. Difficulty moving certain parts of the body freely (poor range of motion). How is this diagnosed? This condition may be diagnosed by your symptoms and medical history. You will also have a physical exam. In general: Fibromyalgia is diagnosed if you have pain, fatigue, and other symptoms for more than 3 months, and symptoms cannot be explained by another condition. Myofascial pain syndrome is diagnosed if you have trigger points in your muscles, and those trigger points are tender and cause pain elsewhere in your body (referred pain). How is this treated? Treatment for these conditions depends on the type that you have. For fibromyalgia, a healthy lifestyle is the most important treatment including aerobic and strength exercises. Different types of medicines are used to help treat pain and include: NSAIDs. Medicines for treating depression. Medicines that help control seizures. Medicines that relax the muscles. Treatment for myofascial pain syndrome includes: Pain medicines, such as NSAIDs. Cooling and stretching of muscles. Massage therapy with myofascial release technique. Trigger point injections. Treating these conditions often requires a team of health care providers. These may include: Your primary care provider. A physical therapist. Complementary health care providers, such as massage therapists or acupuncturists. A psychiatrist for cognitive behavioral therapy. Follow these instructions at home: Medicines Take over-the-counter and prescription medicines only as told by your health care provider. Ask your health care provider if the medicine prescribed to you: Requires you to avoid driving or using machinery. Can cause constipation.  You may need to take these actions to prevent or treat constipation: Drink enough fluid to keep your urine pale   yellow. Take over-the-counter or prescription medicines. Eat foods that are high in fiber, such as beans, whole grains, and fresh fruits and vegetables. Limit foods that are high in fat and processed sugars, such as fried or sweet foods. Lifestyle  Do exercises as told by your health care provider or physical therapist. Practice relaxation techniques to control your stress. You may want to try: Biofeedback. Visual imagery. Hypnosis. Muscle relaxation. Yoga. Meditation. Maintain a healthy lifestyle. This includes eating a healthy diet and getting enough sleep. Do not use any products that contain nicotine or tobacco. These products include cigarettes, chewing tobacco, and vaping devices, such as e-cigarettes. If you need help quitting, ask your health care provider. General instructions Talk to your health care provider about complementary treatments, such as acupuncture or massage. Do not do activities that stress or strain your muscles. This includes repetitive motions and heavy lifting. Keep all follow-up visits. This is important. Where to find support Consider joining a support group with others who are diagnosed with this condition. National Fibromyalgia Association: fmaware.org Where to find more information U.S. Pain Foundation: uspainfoundation.org Contact a health care provider if: You have new symptoms. Your symptoms get worse or your pain is severe. You have side effects from your medicines. You have trouble sleeping. Your condition is causing depression or anxiety. Get help right away if: You have thoughts of hurting yourself or others. Get help right away if you feel like you may hurt yourself or others, or have thoughts about taking your own life. Go to your nearest emergency room or: Call 911. Call the National Suicide Prevention Lifeline at 1-800-273-8255  or 988. This is open 24 hours a day. Text the Crisis Text Line at 741741. This information is not intended to replace advice given to you by your health care provider. Make sure you discuss any questions you have with your health care provider. Document Revised: 05/29/2022 Document Reviewed: 07/22/2021 Elsevier Patient Education  2024 Elsevier Inc.  

## 2023-12-26 NOTE — Progress Notes (Signed)
 Virtual Visit via Video Note  I connected with Haley Reyes on 12/26/23 at 11:00 AM EDT by a video enabled telemedicine application and verified that I am speaking with the correct person using two identifiers.  Location: Patient: home Provider: office   I discussed the limitations of evaluation and management by telemedicine and the availability of in person appointments. The patient expressed understanding and agreed to proceed.  History of Present Illness:  Patient presents today for virtual visit through video visit.  She states that she has chronic pain from fibromyalgia and arthritis.  She is not currently taking gabapentin  or her muscle relaxer.  She would like a refill on these today to help control this pain.  We will send refills to the pharmacy today.  We did offer pain management consult but patient declines at this time. Denies f/c/s, n/v/d, hemoptysis, PND, leg swelling Denies chest pain or edema    Observations/Objective:     07/25/2023    3:22 PM 01/18/2023    9:47 AM 12/07/2022   10:01 AM  Vitals with BMI  Height 5\' 1"  5\' 1"  5\' 1"   Weight 127 lbs 130 lbs 13 oz 130 lbs 3 oz  BMI 24.01 24.73 24.61  Systolic 119 124 295  Diastolic 81 88 80  Pulse 94 91 81      Assessment and Plan:  1. Neuropathy  - gabapentin  (NEURONTIN ) 300 MG capsule; Take 1 capsule (300 mg total) by mouth 3 (three) times daily.  Dispense: 90 capsule; Refill: 3  2. Muscle spasm  - cyclobenzaprine  (FLEXERIL ) 10 MG tablet; Take 1 tablet (10 mg total) by mouth 3 (three) times daily as needed for muscle spasms.  Dispense: 30 tablet; Refill: 0  3. Routine health maintenance  - cyclobenzaprine  (FLEXERIL ) 10 MG tablet; Take 1 tablet (10 mg total) by mouth 3 (three) times daily as needed for muscle spasms.  Dispense: 30 tablet; Refill: 0       I discussed the assessment and treatment plan with the patient. The patient was provided an opportunity to ask questions and all were answered. The  patient agreed with the plan and demonstrated an understanding of the instructions.   The patient was advised to call back or seek an in-person evaluation if the symptoms worsen or if the condition fails to improve as anticipated.  I provided 23 minutes of non-face-to-face time during this encounter.   Jerrlyn Morel, NP

## 2024-01-13 DIAGNOSIS — R32 Unspecified urinary incontinence: Secondary | ICD-10-CM | POA: Diagnosis not present

## 2024-01-13 DIAGNOSIS — R3981 Functional urinary incontinence: Secondary | ICD-10-CM | POA: Diagnosis not present

## 2024-02-15 DIAGNOSIS — N926 Irregular menstruation, unspecified: Secondary | ICD-10-CM | POA: Diagnosis not present

## 2024-02-15 DIAGNOSIS — L03011 Cellulitis of right finger: Secondary | ICD-10-CM | POA: Diagnosis not present

## 2024-02-28 ENCOUNTER — Telehealth: Payer: Self-pay | Admitting: Family Medicine

## 2024-02-28 MED ORDER — NEFFY 2 MG/0.1ML NA SOLN: 1.0000 | NASAL | 1 refills | Status: AC | PRN

## 2024-02-28 NOTE — Telephone Encounter (Signed)
 Pt states she requested an epi-pen(neffy ) a few months ago and never heard back from anyone, pt request a call back

## 2024-02-28 NOTE — Telephone Encounter (Signed)
 LVM informing patient that neffy  was sent in to [facility].

## 2024-03-04 ENCOUNTER — Telehealth: Payer: Self-pay

## 2024-03-04 NOTE — Telephone Encounter (Signed)
 Left message for patient to return call to office to schedule follow up appointment for med refill

## 2024-03-06 ENCOUNTER — Telehealth: Payer: Self-pay

## 2024-03-06 NOTE — Telephone Encounter (Signed)
 Pharmacy Patient Advocate Encounter   Received notification from CoverMyMeds that prior authorization for Neffy  2MG /0.1ML solution is required/requested.   Insurance verification completed.   The patient is insured through [facility] .   Prior Authorization for Neffy  2MG /0.1ML solution has been APPROVED from 07-012025 to 03-04-2025   PA #/Case ID/Reference #: [id number]

## 2024-03-13 DIAGNOSIS — N939 Abnormal uterine and vaginal bleeding, unspecified: Secondary | ICD-10-CM | POA: Diagnosis not present

## 2024-03-13 DIAGNOSIS — R109 Unspecified abdominal pain: Secondary | ICD-10-CM | POA: Diagnosis not present

## 2024-03-24 DIAGNOSIS — R32 Unspecified urinary incontinence: Secondary | ICD-10-CM | POA: Diagnosis not present

## 2024-03-24 DIAGNOSIS — R3981 Functional urinary incontinence: Secondary | ICD-10-CM | POA: Diagnosis not present

## 2024-04-01 ENCOUNTER — Other Ambulatory Visit: Payer: Self-pay

## 2024-04-01 ENCOUNTER — Ambulatory Visit (INDEPENDENT_AMBULATORY_CARE_PROVIDER_SITE_OTHER): Admitting: Internal Medicine

## 2024-04-01 VITALS — BP 120/60 | HR 95 | Temp 98.4°F

## 2024-04-01 DIAGNOSIS — J3089 Other allergic rhinitis: Secondary | ICD-10-CM

## 2024-04-01 DIAGNOSIS — T781XXD Other adverse food reactions, not elsewhere classified, subsequent encounter: Secondary | ICD-10-CM

## 2024-04-01 DIAGNOSIS — R053 Chronic cough: Secondary | ICD-10-CM | POA: Diagnosis not present

## 2024-04-01 DIAGNOSIS — J302 Other seasonal allergic rhinitis: Secondary | ICD-10-CM

## 2024-04-01 MED ORDER — VENTOLIN HFA 108 (90 BASE) MCG/ACT IN AERS: 1.0000 | INHALATION_SPRAY | RESPIRATORY_TRACT | 1 refills | Status: AC | PRN

## 2024-04-01 MED ORDER — CETIRIZINE HCL 10 MG PO TABS: 10.0000 mg | ORAL_TABLET | Freq: Every day | ORAL | 5 refills | Status: AC

## 2024-04-01 MED ORDER — FLUTICASONE PROPIONATE 50 MCG/ACT NA SUSP: NASAL | 5 refills | Status: AC

## 2024-04-01 NOTE — Progress Notes (Signed)
   FOLLOW UP Date of Service/Encounter:  04/01/24   Subjective:  Haley Reyes (DOB: 04-Jan-1976) is a 48 y.o. female who [facility] for follow up for allergic rhinitis, food reactions, cough/shortness of breath.   History obtained from: chart review and patient. Last seen 01/18/2023 with Arlean Mutter and discussed use of Nasacort , Xyzal , Pataday , Albuterol .    Reports wanting to be retested for food allergies. Has trouble with SOB, headaches, fatigue, cramps with wheat, peanuts, cabbage and eggs.  Has an Epipen  but needs refills.    Also notes shortness of breath/cough when she enters certain stores and worse during allergy  season.  Does note post nasal drip and congestion/drainage.  Still smoking.  Not on any allergy  medications but notes she needs to get back on it.  Has Albuterol  that she sometimes uses.  Also wishes to get repeat blood testing for environmental allergens.    Past Medical History: Past Medical History:  Diagnosis Date   Angina at rest [facility])    Anxiety    Arthritis    Fibromyalgia    Insomnia    Neuropathy    PTSD (post-traumatic stress disorder)     Objective:  BP 120/60   Pulse 95   Temp 98.4 F (36.9 C)   SpO2 100%  There is no height or weight on file to calculate BMI. Physical Exam: GEN: alert, well developed HEENT: clear conjunctiva, nose with mild inferior turbinate hypertrophy, pink nasal mucosa, clear rhinorrhea, + cobblestoning HEART: regular rate and rhythm, no murmur LUNGS: clear to auscultation bilaterally, no coughing, unlabored respiration SKIN: no rashes or lesions  Spirometry:  Tracings reviewed. Her effort: Good reproducible efforts. FVC: 2.51L, 91% predicted FEV1: 1.87L, 83% predicted FEV1/FVC ratio: 75% Interpretation: Spirometry consistent with normal pattern.  Please see scanned spirometry results for details.   Assessment:   1. Seasonal and perennial allergic rhinitis   2. Adverse  food reaction, subsequent encounter   3. Chronic cough     Plan/Recommendations:  Allergic Rhinitis: - Uncontrolled, restart Flonase /Zyrtec .  - Positive skin test 2022: trees, grasses, weeds, dust mites  - Use nasal saline rinses before nose sprays such as with Neilmed Sinus Rinse.  Use distilled water.   - Use Flonase  2 sprays each nostril daily. Aim upward and outward - Use Zyrtec  10 mg daily.   Food Reactions: - continue avoidance of wheat, peanut , cabbage, eggs, shellfish.  - initial rxn: SOB, headaches, fatigue, muscle cramps - will repeat testing today for those foods although unclear if these are truly IgE mediated.   - for SKIN only reaction, okay to take Benadryl 2 capsules every 6 hours as needed - for SKIN + ANY additional symptoms, OR IF concern for LIFE THREATENING reaction = Epipen  Autoinjector EpiPen  0.3 mg. - If using Epinephrine  autoinjector, call 911  Shortness of Breath/Cough:  - Normal spirometry today. Do think smoking and uncontrolled allergies are contributing. Keep track of inhaler use.  - Rescue inhaler: Albuterol  2 puffs every 4-6 hours as needed for respiratory symptoms of cough, shortness of breath, or wheezing.   - Work on smoking cessation.      Return in about 6 months (around 10/02/2024).  Arleta Blanch, MD [facility]

## 2024-04-01 NOTE — Patient Instructions (Addendum)
 Allergic Rhinitis: - Positive skin test 2022: trees, grasses, weeds, dust mites  - Use nasal saline rinses before nose sprays such as with Neilmed Sinus Rinse.  Use distilled water.   - Use Flonase  2 sprays each nostril daily. Aim upward and outward - Use Zyrtec  10 mg daily.   Food Reactions: - continue avoidance of wheat, peanut , cabbage, eggs, shellfish.  - will repeat testing today for those foods.  - for SKIN only reaction, okay to take Benadryl 2 capsules every 6 hours as needed - for SKIN + ANY additional symptoms, OR IF concern for LIFE THREATENING reaction = Epipen  Autoinjector EpiPen  0.3 mg. - If using Epinephrine  autoinjector, call 911   Shortness of Breath/Cough:  - Rescue inhaler: Albuterol  2 puffs every 4-6 hours as needed for respiratory symptoms of cough, shortness of breath, or wheezing.

## 2024-04-03 ENCOUNTER — Encounter: Payer: Self-pay | Admitting: Nurse Practitioner

## 2024-04-03 LAB — ALLERGENS W/TOTAL IGE AREA 2
Alternaria Alternata IgE: <0.1 kU/L
Aspergillus Fumigatus IgE: <0.1 kU/L
Bermuda Grass IgE: 23.4 kU/L — AB
Cat Dander IgE: 0.32 kU/L — AB
Cedar, Mountain IgE: 5.52 kU/L — AB
Cladosporium Herbarum IgE: <0.1 kU/L
Cockroach, German IgE: 0.48 kU/L — AB
Common Silver Birch IgE: <0.1 kU/L
Cottonwood IgE: <0.1 kU/L
D Farinae IgE: 2.53 kU/L — AB
D Pteronyssinus IgE: 1.04 kU/L — AB
Dog Dander IgE: 0.84 kU/L — AB
Elm, American IgE: <0.1 kU/L
IgE (Immunoglobulin E), Serum: 250 [IU]/mL (ref 6–495)
Johnson Grass IgE: 19.6 kU/L — AB
Maple/Box Elder IgE: <0.1 kU/L
Mouse Urine IgE: <0.1 kU/L
Oak, White IgE: 1.62 kU/L — AB
Pecan, Hickory IgE: 0.12 kU/L — AB
Penicillium Chrysogen IgE: <0.1 kU/L
Pigweed, Rough IgE: <0.1 kU/L
Ragweed, Short IgE: 24.5 kU/L — AB
Sheep Sorrel IgE Qn: 0.15 kU/L — AB
Timothy Grass IgE: 31.9 kU/L — AB
White Mulberry IgE: <0.1 kU/L

## 2024-04-03 LAB — ALLERGEN PROFILE, SHELLFISH
Clam IgE: 0.64 kU/L — AB
F023-IgE Crab: 0.41 kU/L — AB
F080-IgE Lobster: 0.33 kU/L — AB
F290-IgE Oyster: <0.1 kU/L
Scallop IgE: 0.1 kU/L — AB
Shrimp IgE: 0.7 kU/L — AB

## 2024-04-03 LAB — ALLERGEN, CABBAGE, F216: Allergen Cabbage IgE: <0.1 kU/L

## 2024-04-03 LAB — ALLERGEN, WHEAT, F4: Wheat IgE: 0.47 kU/L — AB

## 2024-04-03 LAB — ALLERGEN EGG WHITE F1: Egg White IgE: <0.1 kU/L

## 2024-04-03 LAB — IGE PEANUT W/COMPONENT REFLEX: Peanut, IgE: <0.1 kU/L

## 2024-04-04 ENCOUNTER — Ambulatory Visit: Payer: Self-pay | Admitting: Internal Medicine

## 2024-04-07 NOTE — Addendum Note (Signed)
 Addended by: NANCEE JON SAILOR on: 04/07/2024 05:36 PM   Modules accepted: Orders

## 2024-04-09 ENCOUNTER — Telehealth: Payer: Self-pay | Admitting: Internal Medicine

## 2024-04-09 NOTE — Telephone Encounter (Signed)
 I spoke with the patient and she had got confused on what she needed to do. I clarified what she needs to do if she was wiling. Patient informed me she was introducing the foods that she once was allergic to since her labs showed negative. Patient is good now.

## 2024-04-09 NOTE — Telephone Encounter (Signed)
 Patient called and stated that she was returning a call that was made to her yesterday. I read the notes to her about the phone call and patient states that she is confused and not sure what she should be doing at this point. Patient also has questions about her spirometry test. Patient is requesting a call back to discuss. Patients call back number is [phone]

## 2024-09-19 ENCOUNTER — Ambulatory Visit: Admitting: Nurse Practitioner

## 2024-09-22 ENCOUNTER — Encounter: Payer: Self-pay | Admitting: Nurse Practitioner

## 2024-09-25 ENCOUNTER — Ambulatory Visit: Payer: Self-pay | Admitting: Nurse Practitioner

## 2024-10-09 ENCOUNTER — Telehealth: Payer: Self-pay | Admitting: Nurse Practitioner

## 2024-10-09 NOTE — Telephone Encounter (Unsigned)
 Copied from CRM #[id number]. Topic: Clinical - Medical Advice >> Oct 09, 2024  4:53 PM Haley Reyes wrote: Reason for CRM: Pt is req Ibuprofen  600 due to pain on right foot, osteoarthritis, fibromyalgia. She does take Gabapentin , but she cannot do normal activities when she takes this medication. Please call and update pt on #[phone]

## 2024-10-10 ENCOUNTER — Other Ambulatory Visit: Payer: Self-pay

## 2024-10-10 MED ORDER — IBUPROFEN 600 MG PO TABS: 600.0000 mg | ORAL_TABLET | Freq: Three times a day (TID) | ORAL | 0 refills | Status: AC | PRN

## 2024-10-10 NOTE — Telephone Encounter (Signed)
 Sent to provider. KH

## 2024-10-10 NOTE — Telephone Encounter (Signed)
 Pt is req Ibuprofen  600 due to pain on right foot, osteoarthritis, fibromyalgia. She does take Gabapentin , but she cannot do normal activities when she takes this medication.   Please advise [facility]
